# Patient Record
Sex: Male | Born: 1995 | Race: White | Hispanic: No | Marital: Married | Smoking: Current every day smoker
Health system: Southern US, Community
[De-identification: ages and names within clinical notes are randomized; demographics above are authoritative.]

## PROBLEM LIST (undated history)

## (undated) DIAGNOSIS — F419 Anxiety disorder, unspecified: Secondary | ICD-10-CM

## (undated) DIAGNOSIS — F909 Attention-deficit hyperactivity disorder, unspecified type: Secondary | ICD-10-CM

## (undated) DIAGNOSIS — K219 Gastro-esophageal reflux disease without esophagitis: Secondary | ICD-10-CM

## (undated) DIAGNOSIS — J45909 Unspecified asthma, uncomplicated: Secondary | ICD-10-CM

## (undated) DIAGNOSIS — T7840XA Allergy, unspecified, initial encounter: Secondary | ICD-10-CM

## (undated) DIAGNOSIS — R519 Headache, unspecified: Secondary | ICD-10-CM

## (undated) HISTORY — DX: Gastro-esophageal reflux disease without esophagitis: K21.9

## (undated) HISTORY — PX: TONSILLECTOMY: SUR1361

## (undated) HISTORY — DX: Anxiety disorder, unspecified: F41.9

## (undated) HISTORY — DX: Headache, unspecified: R51.9

## (undated) HISTORY — DX: Allergy, unspecified, initial encounter: T78.40XA

---
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
## 2017-01-25 ENCOUNTER — Emergency Department: Payer: BLUE CROSS/BLUE SHIELD

## 2017-01-25 ENCOUNTER — Emergency Department
Admission: EM | Admit: 2017-01-25 | Discharge: 2017-01-25 | Disposition: A | Discharge status: Home / Self Care | Payer: BLUE CROSS/BLUE SHIELD | Attending: Emergency Medicine | Admitting: Emergency Medicine

## 2017-01-25 ENCOUNTER — Other Ambulatory Visit: Payer: Self-pay

## 2017-01-25 DIAGNOSIS — J45909 Unspecified asthma, uncomplicated: Secondary | ICD-10-CM | POA: Diagnosis not present

## 2017-01-25 DIAGNOSIS — R0789 Other chest pain: Secondary | ICD-10-CM

## 2017-01-25 DIAGNOSIS — Z79899 Other long term (current) drug therapy: Secondary | ICD-10-CM | POA: Diagnosis not present

## 2017-01-25 LAB — BASIC METABOLIC PANEL
Anion gap: 6 (ref 5–15)
BUN: 14 mg/dL (ref 6–20)
CALCIUM: 9.6 mg/dL (ref 8.9–10.3)
CO2: 27 mmol/L (ref 22–32)
CREATININE: 1.12 mg/dL (ref 0.61–1.24)
Chloride: 105 mmol/L (ref 101–111)
GFR calc non Af Amer: >60 mL/min (ref >60)
Glucose, Bld: 98 mg/dL (ref 65–99)
Potassium: 4.2 mmol/L (ref 3.5–5.1)
SODIUM: 138 mmol/L (ref 135–145)

## 2017-01-25 LAB — CBC
HCT: 40.5 % (ref 40.0–52.0)
Hemoglobin: 14.1 g/dL (ref 13.0–18.0)
MCH: 28.6 pg (ref 26.0–34.0)
MCHC: 34.7 g/dL (ref 32.0–36.0)
MCV: 82.5 fL (ref 80.0–100.0)
PLATELETS: 207 10*3/uL (ref 150–440)
RBC: 4.91 MIL/uL (ref 4.40–5.90)
RDW: 13.7 % (ref 11.5–14.5)
WBC: 5.9 10*3/uL (ref 3.8–10.6)

## 2017-01-25 LAB — TROPONIN I

## 2017-01-25 MED ORDER — NAPROXEN 500 MG PO TABS: 500.0000 mg | ORAL_TABLET | Freq: Two times a day (BID) | ORAL | 2 refills | Status: DC

## 2017-01-25 NOTE — ED Notes (Signed)
Topaz not working. Pt tried to sign x 2

## 2017-01-25 NOTE — ED Triage Notes (Signed)
Pt c/o chest pain for the last month - feels like "tazers are inside each side of his body and the tazer has a timer on it and goes off every 5 min" - reports at times that he becomes short of breath while talking

## 2017-01-25 NOTE — ED Notes (Signed)
Pt c/o chest pain that radiates down the sides of his chest and "feels like a tazer"

## 2017-01-25 NOTE — ED Provider Notes (Signed)
[facility] Emergency Department Provider Note   ____________________________________________    I have reviewed the triage vital signs and the nursing notes.   HISTORY  Chief Complaint Chest Pain     HPI Joseph Keller is a 21 y.o. male who presents with complaints of bilateral lower flank/chest discomfort.  Patient reports the pain is been occurring over the last 3-4 weeks and is worse with certain movements, he reports it is currently a dull ache.  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  Denies shortness of breath.  No calf pain or swelling.  No history of heart disease.  No nausea or vomiting or abdominal pain.  Has not taken anything for this   Past Medical History:  Diagnosis Date  . ADHD (attention deficit hyperactivity disorder)   . Asthma     Patient Active Problem List   Diagnosis Date Noted  . 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 Other affective psychosis   . MDD (major depressive disorder) 02/27/2015    Past Surgical History:  Procedure Laterality Date  . TONSILLECTOMY      Prior to Admission medications   Medication Sig Start Date End Date Taking? Authorizing Provider  ciprofloxacin (CIPRO) 500 MG tablet Take 1 tablet (500 mg total) by mouth 2 (two) times daily. 07/17/16   Jene EveryKinner, Sarafina Puthoff, MD  divalproex (DEPAKOTE) 250 MG DR tablet Take 250 mg by mouth 2 (two) times daily. 04/27/16   [provider]  escitalopram (LEXAPRO) 10 MG tablet Take 1 tablet (10 mg total) by mouth at bedtime. 03/05/15   Thedora HindersSevilla Saez-Benito, Miriam, MD  ibuprofen (ADVIL,MOTRIN) 600 MG tablet Take 1 tablet by mouth three times daily with meals 05/15/16   Loleta RoseForbach, Cory, MD  lidocaine (LIDODERM) 5 % Place 1 patch onto the skin every 12 (twelve) hours. Remove & Discard patch within 12 hours or as directed by MD.  Wynelle FannyLeave the patch off for 12 hours before applying a new one. 05/15/16 05/15/17  Loleta RoseForbach,  Cory, MD  naproxen (NAPROSYN) 500 MG tablet Take 1 tablet (500 mg total) by mouth 2 (two) times daily with a meal. 01/25/17   Jene EveryKinner, Erbie Arment, MD     Allergies Patient has no known allergies.  No family history on file.  Social History Social History   Tobacco Use  . Smoking status: Never Smoker  . Smokeless tobacco: Never Used  Substance Use Topics  . Alcohol use: No  . Drug use: No    Review of Systems  Constitutional: No fever/chills Eyes: No visual changes.  ENT: No sore throat. Cardiovascular: As above Respiratory: Denies shortness of breath. Gastrointestinal:   No nausea, no vomiting.   Genitourinary: Negative for dysuria. Musculoskeletal: Negative for back pain. Skin: Negative for rash. Neurological: Negative for headaches    ____________________________________________   PHYSICAL EXAM:  VITAL SIGNS: ED Triage Vitals  Enc Vitals Group     BP 01/25/17 1248 132/76     Pulse Rate 01/25/17 1248 85     Resp 01/25/17 1248 16     Temp 01/25/17 1248 98 F (36.7 C)     Temp Source 01/25/17 1248 Oral     SpO2 01/25/17 1248 98 %     Weight 01/25/17 1234 64.4 kg (142 lb)     Height 01/25/17 1234 1.524 m (5')     Head Circumference --      Peak Flow --      Pain Score  01/25/17 1234 9     Pain Loc --      Pain Edu? --      Excl. in GC? --     Constitutional: Alert and oriented. No acute distress. Pleasant and interactive Eyes: Conjunctivae are normal.   Nose: No congestion/rhinnorhea. Mouth/Throat: Mucous membranes are moist.    Cardiovascular: Normal rate, regular rhythm. Grossly normal heart sounds.  Good peripheral circulation. Respiratory: Normal respiratory effort.  No retractions. Lungs CTAB. Gastrointestinal: Soft and nontender. No distention.  No CVA tenderness.  Mild tenderness along bilateral superior oblique muscles  Musculoskeletal:  Warm and well perfused Neurologic:  Normal speech and language. No gross focal neurologic deficits are  appreciated.  Skin:  Skin is warm, dry and intact. No rash noted. Psychiatric: Mood and affect are normal. Speech and behavior are normal.  ____________________________________________   LABS (all labs ordered are listed, but only abnormal results are displayed)  Labs Reviewed  BASIC METABOLIC PANEL  CBC  TROPONIN I   ____________________________________________  EKG  ED ECG REPORT I, Jene EveryKINNER, Solon Alban, the attending physician, personally viewed and interpreted this ECG.  Date: 01/25/2017  Rhythm: normal sinus rhythm QRS Axis: normal Intervals: normal ST/T Wave abnormalities: normal Narrative Interpretation: no evidence of acute ischemia  ____________________________________________  RADIOLOGY  Chest x-ray unremarkable ____________________________________________   PROCEDURES  Procedure(s) performed: No  Procedures   Critical Care performed: No ____________________________________________   INITIAL IMPRESSION / ASSESSMENT AND PLAN / ED COURSE  Pertinent labs & imaging results that were available during my care of the patient were reviewed by me and considered in my medical decision making (see chart for details).  Patient well-appearing in no acute distress.  Several weeks of aching discomfort as documented above.  Differential diagnosis includes musculoskeletal pain, less likely pneumonia/bronchitis.  Very unlikely to be myocarditis or ACS  Lab work is reassuring, chest x-ray is normal.  Given the area of tenderness I suspect muscular skeletal pain will treat with NSAIDs outpatient follow-up as needed.  Return precautions discussed    ____________________________________________   FINAL CLINICAL IMPRESSION(S) / ED DIAGNOSES  Final diagnoses:  Chest wall pain        Note:  This document was prepared using Dragon voice recognition software and may include unintentional dictation errors.    Jene EveryKinner, Paizleigh Wilds, MD 01/25/17 [phone]

## 2017-02-14 ENCOUNTER — Other Ambulatory Visit: Payer: Self-pay

## 2017-02-14 ENCOUNTER — Emergency Department
Admission: EM | Admit: 2017-02-14 | Discharge: 2017-02-14 | Disposition: A | Discharge status: Home / Self Care | Payer: BLUE CROSS/BLUE SHIELD | Attending: Student in an Organized Health Care Education/Training Program | Admitting: Student in an Organized Health Care Education/Training Program

## 2017-02-14 ENCOUNTER — Emergency Department: Payer: BLUE CROSS/BLUE SHIELD

## 2017-02-14 ENCOUNTER — Encounter: Payer: Self-pay | Admitting: *Deleted

## 2017-02-14 DIAGNOSIS — X500XXA Overexertion from strenuous movement or load, initial encounter: Secondary | ICD-10-CM | POA: Insufficient documentation

## 2017-02-14 DIAGNOSIS — R079 Chest pain, unspecified: Secondary | ICD-10-CM | POA: Diagnosis present

## 2017-02-14 DIAGNOSIS — Z79899 Other long term (current) drug therapy: Secondary | ICD-10-CM | POA: Diagnosis not present

## 2017-02-14 DIAGNOSIS — F909 Attention-deficit hyperactivity disorder, unspecified type: Secondary | ICD-10-CM | POA: Insufficient documentation

## 2017-02-14 DIAGNOSIS — R0602 Shortness of breath: Secondary | ICD-10-CM | POA: Insufficient documentation

## 2017-02-14 DIAGNOSIS — J45909 Unspecified asthma, uncomplicated: Secondary | ICD-10-CM | POA: Insufficient documentation

## 2017-02-14 DIAGNOSIS — Z733 Stress, not elsewhere classified: Secondary | ICD-10-CM | POA: Insufficient documentation

## 2017-02-14 LAB — BASIC METABOLIC PANEL
ANION GAP: 8 (ref 5–15)
BUN: 16 mg/dL (ref 6–20)
CHLORIDE: 105 mmol/L (ref 101–111)
CO2: 26 mmol/L (ref 22–32)
Calcium: 9.3 mg/dL (ref 8.9–10.3)
Creatinine, Ser: 1.09 mg/dL (ref 0.61–1.24)
GFR calc non Af Amer: >60 mL/min (ref >60)
Glucose, Bld: 106 mg/dL — ABNORMAL HIGH (ref 65–99)
POTASSIUM: 4.2 mmol/L (ref 3.5–5.1)
SODIUM: 139 mmol/L (ref 135–145)

## 2017-02-14 LAB — CBC
HCT: 37.8 % — ABNORMAL LOW (ref 40.0–52.0)
HEMOGLOBIN: 13.3 g/dL (ref 13.0–18.0)
MCH: 29 pg (ref 26.0–34.0)
MCHC: 35.2 g/dL (ref 32.0–36.0)
MCV: 82.2 fL (ref 80.0–100.0)
Platelets: 221 10*3/uL (ref 150–440)
RBC: 4.6 MIL/uL (ref 4.40–5.90)
RDW: 13.5 % (ref 11.5–14.5)
WBC: 6.6 10*3/uL (ref 3.8–10.6)

## 2017-02-14 LAB — TROPONIN I: Troponin I: <0.03 ng/mL (ref <0.03)

## 2017-02-14 MED ORDER — ALBUTEROL SULFATE HFA 108 (90 BASE) MCG/ACT IN AERS: 2.0000 | INHALATION_SPRAY | Freq: Four times a day (QID) | RESPIRATORY_TRACT | 2 refills | Status: DC | PRN

## 2017-02-14 MED ORDER — PREDNISONE 20 MG PO TABS: 40.0000 mg | ORAL_TABLET | Freq: Every day | ORAL | 0 refills | Status: AC

## 2017-02-14 NOTE — ED Provider Notes (Signed)
[facility] Emergency Department Provider Note    First MD Initiated Contact with Patient 02/14/17 2212     (approximate)  I have reviewed the triage vital signs and the nursing notes.   HISTORY  Chief Complaint Chest Pain    HPI Joseph Keller is a 22 y.o. male presents with several weeks of shortness of breath and anterior chest wall pain.  Patient was seen in the ER for similar symptoms recently.  Was discharged on anti-inflammatories and states that he got better for a short period of time but came back over the past few days.  States he has been having more stress at work and having to lift heavy objects as he works at [employer].  States he was lifting something out of the door today and felt something pop in his chest so he came to the ER.  Does have a history of asthma.  Has had a nonproductive cough.  No fevers.  No recent antibiotics.  Past Medical History:  Diagnosis Date  . ADHD (attention deficit hyperactivity disorder)   . Asthma    History reviewed. No pertinent family history. Past Surgical History:  Procedure Laterality Date  . TONSILLECTOMY     Patient Active Problem List   Diagnosis Date Noted  . 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 Other affective psychosis   . MDD (major depressive disorder) 02/27/2015      Prior to Admission medications   Medication Sig Start Date End Date Taking? Authorizing Provider  albuterol (PROVENTIL HFA;VENTOLIN HFA) 108 (90 Base) MCG/ACT inhaler Inhale 2 puffs into the lungs every 6 (six) hours as needed for wheezing or shortness of breath. 02/14/17   Willy Eddyobinson, Jamila Slatten, MD  ciprofloxacin (CIPRO) 500 MG tablet Take 1 tablet (500 mg total) by mouth 2 (two) times daily. 07/17/16   Jene EveryKinner, Robert, MD  divalproex (DEPAKOTE) 250 MG DR tablet Take 250 mg by mouth 2 (two) times daily. 04/27/16   [provider]  escitalopram (LEXAPRO) 10 MG tablet Take 1  tablet (10 mg total) by mouth at bedtime. 03/05/15   Thedora HindersSevilla Saez-Benito, Miriam, MD  ibuprofen (ADVIL,MOTRIN) 600 MG tablet Take 1 tablet by mouth three times daily with meals 05/15/16   Loleta RoseForbach, Cory, MD  lidocaine (LIDODERM) 5 % Place 1 patch onto the skin every 12 (twelve) hours. Remove & Discard patch within 12 hours or as directed by MD.  Wynelle FannyLeave the patch off for 12 hours before applying a new one. 05/15/16 05/15/17  Loleta RoseForbach, Cory, MD  naproxen (NAPROSYN) 500 MG tablet Take 1 tablet (500 mg total) by mouth 2 (two) times daily with a meal. 01/25/17   Jene EveryKinner, Robert, MD  predniSONE (DELTASONE) 20 MG tablet Take 2 tablets (40 mg total) by mouth daily for 5 days. 02/14/17 02/19/17  Willy Eddyobinson, Tarrie Mcmichen, MD    Allergies Patient has no known allergies.    Social History Social History   Tobacco Use  . Smoking status: Never Smoker  . Smokeless tobacco: Never Used  Substance Use Topics  . Alcohol use: No  . Drug use: No    Review of Systems Patient denies headaches, rhinorrhea, blurry vision, numbness, shortness of breath, chest pain, edema, cough, abdominal pain, nausea, vomiting, diarrhea, dysuria, fevers, rashes or hallucinations unless otherwise stated above in HPI. ____________________________________________   PHYSICAL EXAM:  VITAL SIGNS: Vitals:   02/14/17 2059  BP: 119/71  Pulse: 78  Resp: 16  Temp: 97.6 F (36.4 C)  SpO2:  97%    Constitutional: Alert and oriented. Well appearing and in no acute distress. Eyes: Conjunctivae are normal.  Head: Atraumatic. Nose: No congestion/rhinnorhea. Mouth/Throat: Mucous membranes are moist.   Neck: No stridor. Painless ROM.  Cardiovascular: Normal rate, regular rhythm. Grossly normal heart sounds.  Good peripheral circulation. Respiratory: Normal respiratory effort.  No retractions. Lungs with coarse breath sounds bilaterally Gastrointestinal: Soft and nontender. No distention. No abdominal bruits. No CVA tenderness. Genitourinary:    Musculoskeletal: + ttp of anterior chest wall. No lower extremity tenderness nor edema.  No joint effusions. Neurologic:  Normal speech and language. No gross focal neurologic deficits are appreciated. No facial droop Skin:  Skin is warm, dry and intact. No rash noted. Psychiatric: Mood and affect are normal. Speech and behavior are normal.  ____________________________________________   LABS (all labs ordered are listed, but only abnormal results are displayed)  Results for orders placed or performed during the hospital encounter of 02/14/17 (from the past 24 hour(s))  Basic metabolic panel     Status: Abnormal   Collection Time: 02/14/17  8:56 PM  Result Value Ref Range   Sodium 139 135 - 145 mmol/L   Potassium 4.2 3.5 - 5.1 mmol/L   Chloride 105 101 - 111 mmol/L   CO2 26 22 - 32 mmol/L   Glucose, Bld 106 (H) 65 - 99 mg/dL   BUN 16 6 - 20 mg/dL   Creatinine, Ser 1.61 0.61 - 1.24 mg/dL   Calcium 9.3 8.9 - 09.6 mg/dL   GFR calc non Af Amer >60 >60 mL/min   GFR calc Af Amer >60 >60 mL/min   Anion gap 8 5 - 15  CBC     Status: Abnormal   Collection Time: 02/14/17  8:56 PM  Result Value Ref Range   WBC 6.6 3.8 - 10.6 K/uL   RBC 4.60 4.40 - 5.90 MIL/uL   Hemoglobin 13.3 13.0 - 18.0 g/dL   HCT 04.5 (L) 40.9 - 81.1 %   MCV 82.2 80.0 - 100.0 fL   MCH 29.0 26.0 - 34.0 pg   MCHC 35.2 32.0 - 36.0 g/dL   RDW 91.4 78.2 - 95.6 %   Platelets 221 150 - 440 K/uL  Troponin I     Status: None   Collection Time: 02/14/17  8:56 PM  Result Value Ref Range   Troponin I <0.03 <0.03 ng/mL   ____________________________________________  EKG My review and personal interpretation at Time: 21:00   Indication: chest pain  Rate: 75  Rhythm: sinus Axis: normal Other: normal intervals, no wpw, brugada ____________________________________________  RADIOLOGY  I personally reviewed all radiographic images ordered to evaluate for the above acute complaints and reviewed radiology reports and  findings.  These findings were personally discussed with the patient.  Please see medical record for radiology report.  ____________________________________________   PROCEDURES  Procedure(s) performed:  Procedures    Critical Care performed: no ____________________________________________   INITIAL IMPRESSION / ASSESSMENT AND PLAN / ED COURSE  Pertinent labs & imaging results that were available during my care of the patient were reviewed by me and considered in my medical decision making (see chart for details).  DDX: ACS, pericarditis, esophagitis, boerhaaves, pe, dissection, pna, bronchitis, costochondritis   Joseph Solum is a 22 y.o. who presents to the ED with chest pain as described above.  Seems primarily muscular skeletal in nature given his report of worsening with movement and heavy lifting.  EKG shows no ischemia or dysrhythmia.  Troponin is negative.  Patient is low risk by Wells and is PERC negative.  Chest x-ray shows no pneumothorax or pneumonia.  Does have some coarse breath sounds with his history of asthma we will treat for bronchitis.  May also have component of pleurisy.  Will have patient continue with anti-inflammatories and follow-up with PCP.  Have discussed with the patient and available family all diagnostics and treatments performed thus far and all questions were answered to the best of my ability. The patient demonstrates understanding and agreement with plan.       ____________________________________________   FINAL CLINICAL IMPRESSION(S) / ED DIAGNOSES  Final diagnoses:  Chest pain, unspecified type      NEW MEDICATIONS STARTED DURING THIS VISIT:  New Prescriptions   ALBUTEROL (PROVENTIL HFA;VENTOLIN HFA) 108 (90 BASE) MCG/ACT INHALER    Inhale 2 puffs into the lungs every 6 (six) hours as needed for wheezing or shortness of breath.   PREDNISONE (DELTASONE) 20 MG TABLET    Take 2 tablets (40 mg total) by mouth daily for 5 days.      Note:  This document was prepared using Dragon voice recognition software and may include unintentional dictation errors.    Willy Eddy, MD 02/14/17 [phone]

## 2017-02-14 NOTE — ED Notes (Signed)
Pt signed esignature.  D/c isnt to pt

## 2017-02-14 NOTE — ED Notes (Signed)
Pt reports intermittent chest pain.  Pt states coughing up clear mucous. No fever.  No sob. Nonsmoker. Pt alert.  family with pt

## 2017-02-14 NOTE — ED Triage Notes (Signed)
Pt to ED reporting return of burning chest pain over the past 2 days. Pt reports he was seen two weeks ago and given medication. Pt reports pain diminished but has since returned. Intermittent SOB and productive cough reported. No fevers. Pain radiated to both sides of ribs.

## 2017-05-20 ENCOUNTER — Other Ambulatory Visit: Payer: Self-pay

## 2017-05-20 DIAGNOSIS — R51 Headache: Secondary | ICD-10-CM | POA: Insufficient documentation

## 2017-05-20 DIAGNOSIS — J45909 Unspecified asthma, uncomplicated: Secondary | ICD-10-CM | POA: Insufficient documentation

## 2017-05-20 DIAGNOSIS — Z79899 Other long term (current) drug therapy: Secondary | ICD-10-CM | POA: Insufficient documentation

## 2017-05-20 NOTE — ED Triage Notes (Signed)
Pt reports that he has had a headache for the past 4 days that has progressively gotten worse - reports photophobia

## 2017-05-20 NOTE — ED Notes (Signed)
Significant other to stat desk asking about wait time. Given update on wait time. Verbalizes understanding.

## 2017-05-21 ENCOUNTER — Emergency Department: Payer: BLUE CROSS/BLUE SHIELD

## 2017-05-21 ENCOUNTER — Emergency Department
Admission: EM | Admit: 2017-05-21 | Discharge: 2017-05-21 | Disposition: A | Discharge status: Home / Self Care | Payer: BLUE CROSS/BLUE SHIELD | Attending: Emergency Medicine | Admitting: Emergency Medicine

## 2017-05-21 DIAGNOSIS — R519 Headache, unspecified: Secondary | ICD-10-CM

## 2017-05-21 DIAGNOSIS — R51 Headache: Secondary | ICD-10-CM

## 2017-05-21 MED ORDER — KETOROLAC TROMETHAMINE 30 MG/ML IJ SOLN
10.0000 mg | Freq: Once | INTRAMUSCULAR | Status: AC
  Administered 2017-05-21: 9.9 mg via INTRAVENOUS

## 2017-05-21 MED ORDER — PROCHLORPERAZINE EDISYLATE 10 MG/2ML IJ SOLN
5.0000 mg | Freq: Once | INTRAMUSCULAR | Status: DC
  Filled 2017-05-21: qty 2

## 2017-05-21 MED ORDER — PROCHLORPERAZINE EDISYLATE 10 MG/2ML IJ SOLN
5.0000 mg | INTRAMUSCULAR | Status: AC
  Administered 2017-05-21: 5 mg via INTRAVENOUS

## 2017-05-21 MED ORDER — PROCHLORPERAZINE MALEATE 10 MG PO TABS: 10.0000 mg | ORAL_TABLET | Freq: Four times a day (QID) | ORAL | 0 refills | Status: DC | PRN

## 2017-05-21 MED ORDER — DIPHENHYDRAMINE HCL 50 MG/ML IJ SOLN
25.0000 mg | Freq: Once | INTRAMUSCULAR | Status: DC
  Filled 2017-05-21: qty 1

## 2017-05-21 MED ORDER — DIPHENHYDRAMINE HCL 50 MG/ML IJ SOLN
25.0000 mg | Freq: Once | INTRAMUSCULAR | Status: AC
  Administered 2017-05-21: 25 mg via INTRAVENOUS

## 2017-05-21 MED ORDER — KETOROLAC TROMETHAMINE 30 MG/ML IJ SOLN
15.0000 mg | Freq: Once | INTRAMUSCULAR | Status: DC
  Filled 2017-05-21: qty 1

## 2017-05-21 NOTE — ED Notes (Signed)
Reviewed discharge instructions, follow-up care, and prescriptions with patient and family. Patient  And familyverbalized understanding of all information reviewed. Patient stable, with no distress noted at this time.

## 2017-05-21 NOTE — ED Provider Notes (Signed)
[facility] Emergency Department Provider Note   ____________________________________________   First MD Initiated Contact with Patient 05/21/17 [phone]     (approximate)  I have reviewed the triage vital signs and the nursing notes.   HISTORY  Chief Complaint Headache    HPI Joseph Keller is a 22 y.o. male who presents to the ED from home with a chief complaint of headache.  Patient has a history of depression, no history of migraine headaches who reports a headache at his temples for the past 4 days.  Describes the pain as a "warm stabbing", gradual onset, nonradiating pain associated with photophobia only in sunlight, not with any other light.  Vomited twice yesterday but none today.  Has tried over-the-counter ibuprofen with partial relief of symptoms.  Denies recent fever, chills, vision changes, neck pain, sinus congestion/pressure chest pain, shortness of breath, abdominal pain, diarrhea.  Denies recent travel or trauma.   Past Medical History:  Diagnosis Date  . ADHD (attention deficit hyperactivity disorder)   . Asthma     Patient Active Problem List   Diagnosis Date Noted  . 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 Other affective psychosis   . MDD (major depressive disorder) 02/27/2015    Past Surgical History:  Procedure Laterality Date  . TONSILLECTOMY      Prior to Admission medications   Medication Sig Start Date End Date Taking? Authorizing Provider  albuterol (PROVENTIL HFA;VENTOLIN HFA) 108 (90 Base) MCG/ACT inhaler Inhale 2 puffs into the lungs every 6 (six) hours as needed for wheezing or shortness of breath. 02/14/17   Willy Eddy, MD  ciprofloxacin (CIPRO) 500 MG tablet Take 1 tablet (500 mg total) by mouth 2 (two) times daily. 07/17/16   Jene Every, MD  divalproex (DEPAKOTE) 250 MG DR tablet Take 250 mg by mouth 2 (two) times daily. 04/27/16   [provider]    escitalopram (LEXAPRO) 10 MG tablet Take 1 tablet (10 mg total) by mouth at bedtime. 03/05/15   Thedora Hinders, MD  ibuprofen (ADVIL,MOTRIN) 600 MG tablet Take 1 tablet by mouth three times daily with meals 05/15/16   Loleta Rose, MD  naproxen (NAPROSYN) 500 MG tablet Take 1 tablet (500 mg total) by mouth 2 (two) times daily with a meal. 01/25/17   Jene Every, MD    Allergies Patient has no known allergies.  Family history None for migraine headaches None for cerebral aneurysm  Social History Social History   Tobacco Use  . Smoking status: Never Smoker  . Smokeless tobacco: Never Used  Substance Use Topics  . Alcohol use: No  . Drug use: No    Review of Systems  Constitutional: No fever/chills. Eyes: No visual changes. ENT: No sore throat. Cardiovascular: Denies chest pain. Respiratory: Denies shortness of breath. Gastrointestinal: No abdominal pain.  No nausea, no vomiting.  No diarrhea.  No constipation. Genitourinary: Negative for dysuria. Musculoskeletal: Negative for back pain. Skin: Negative for rash. Neurological: Positive for headache.  Negative for focal weakness or numbness.   ____________________________________________   PHYSICAL EXAM:  VITAL SIGNS: ED Triage Vitals  Enc Vitals Group     BP 05/20/17 1824 121/79     Pulse Rate 05/20/17 1824 70     Resp 05/20/17 1824 16     Temp 05/20/17 1824 98 F (36.7 C)     Temp Source 05/20/17 1824 Oral     SpO2 05/20/17 1824 99 %     Weight  05/20/17 1823 144 lb (65.3 kg)     Height 05/20/17 1823 5' (1.524 m)     Head Circumference --      Peak Flow --      Pain Score 05/20/17 1822 8     Pain Loc --      Pain Edu? --      Excl. in GC? --     Constitutional: Alert and oriented. Well appearing and in no acute distress. Eyes: Conjunctivae are normal. PERRL. EOMI. Head: Atraumatic.  Sinuses nontender to palpation.  Mild pain to bilateral temples on palpation. Nose: No  congestion/rhinnorhea. Mouth/Throat: Mucous membranes are moist.  Oropharynx non-erythematous. Neck: No stridor.  No carotid bruits.  Supple neck without meningismus. Cardiovascular: Normal rate, regular rhythm. Grossly normal heart sounds.  Good peripheral circulation. Respiratory: Normal respiratory effort.  No retractions. Lungs CTAB. Gastrointestinal: Soft and nontender. No distention. No abdominal bruits. No CVA tenderness. Musculoskeletal: No lower extremity tenderness nor edema.  No joint effusions. Neurologic: Alert and oriented x3.  CN II-XII grossly intact.  MAEx4.  Normal speech and language. No gross focal neurologic deficits are appreciated. No gait instability. Skin:  Skin is warm, dry and intact. No rash noted.  No petechiae. Psychiatric: Mood and affect are normal. Speech and behavior are normal.  ____________________________________________   LABS (all labs ordered are listed, but only abnormal results are displayed)  Labs Reviewed - No data to display ____________________________________________  EKG  None ____________________________________________  RADIOLOGY  ED MD interpretation: No acute intracranial abnormalities  Official radiology report(s): Ct Head Wo Contrast  Result Date: 05/21/2017 CLINICAL DATA:  Acute onset of worsening headache.  Photophobia. EXAM: CT HEAD WITHOUT CONTRAST TECHNIQUE: Contiguous axial images were obtained from the base of the skull through the vertex without intravenous contrast. COMPARISON:  CT of the head performed 12/12/2013 FINDINGS: Brain: No evidence of acute infarction, hemorrhage, hydrocephalus, extra-axial collection or mass lesion/mass effect. The posterior fossa, including the cerebellum, brainstem and fourth ventricle, is within normal limits. The third and lateral ventricles, and basal ganglia are unremarkable in appearance. The cerebral hemispheres are symmetric in appearance, with normal gray-white differentiation. No mass  effect or midline shift is seen. Vascular: No hyperdense vessel or unexpected calcification. Skull: There is no evidence of fracture; visualized osseous structures are unremarkable in appearance. Sinuses/Orbits: The visualized portions of the orbits are within normal limits. The paranasal sinuses and mastoid air cells are well-aerated. Other: No significant soft tissue abnormalities are seen. IMPRESSION: Unremarkable noncontrast CT of the head. Electronically Signed   By: Roanna RaiderJeffery  Chang M.D.   On: 05/21/2017 02:24    ____________________________________________   PROCEDURES  Procedure(s) performed: None  Procedures  Critical Care performed: No  ____________________________________________   INITIAL IMPRESSION / ASSESSMENT AND PLAN / ED COURSE  As part of my medical decision making, I reviewed the following data within the electronic MEDICAL RECORD NUMBER History obtained from family, Nursing notes reviewed and incorporated, Old chart reviewed, Radiograph reviewed and Notes from prior ED visits   22 year old male who presents with a 4-day history of gradual onset headache. Differential diagnosis includes, but is not limited to, intracranial hemorrhage, meningitis/encephalitis, previous head trauma, cavernous venous thrombosis, tension headache, temporal arteritis, migraine or migraine equivalent, idiopathic intracranial hypertension, and non-specific headache.  Patient is well-appearing, supple neck on exam, no focal neurological deficits.  Discussed risk/benefits of obtaining CT scan of the head; patient requests CT scan.  Will administer IM Compazine, Benadryl and Toradol; will reassess.  Clinical Course  as of May 21 253  Sun May 21, 2017  0253 Updated patient and family member negative CT results.  He is resting comfortably without headache.  Remains neurologically intact without focal deficits; neck remains supple without meningismus.  Strict return precautions given.  Patient and family  member verbalize understanding and agree with plan of care.   [JS]    Clinical Course User Index [JS] Irean Hong, MD     ____________________________________________   FINAL CLINICAL IMPRESSION(S) / ED DIAGNOSES  Final diagnoses:  Acute nonintractable headache, unspecified headache type     ED Discharge Orders    None       Note:  This document was prepared using Dragon voice recognition software and may include unintentional dictation errors.    Irean Hong, MD 05/21/17 [phone]

## 2017-05-21 NOTE — Discharge Instructions (Addendum)
1.  You may take Compazine (#20) in addition to Tylenol/Ibuprofen as needed for headache/vomiting. 2.  Return to the ER for worsening symptoms, persistent vomiting, lethargy or other concerns.

## 2018-04-25 ENCOUNTER — Emergency Department: Payer: PRIVATE HEALTH INSURANCE

## 2018-04-25 ENCOUNTER — Emergency Department
Admission: EM | Admit: 2018-04-25 | Discharge: 2018-04-25 | Disposition: A | Discharge status: Home / Self Care | Payer: PRIVATE HEALTH INSURANCE | Attending: Emergency Medicine | Admitting: Emergency Medicine

## 2018-04-25 ENCOUNTER — Encounter: Payer: Self-pay | Admitting: Emergency Medicine

## 2018-04-25 ENCOUNTER — Other Ambulatory Visit: Payer: Self-pay

## 2018-04-25 DIAGNOSIS — J45909 Unspecified asthma, uncomplicated: Secondary | ICD-10-CM | POA: Diagnosis not present

## 2018-04-25 DIAGNOSIS — R197 Diarrhea, unspecified: Secondary | ICD-10-CM | POA: Diagnosis present

## 2018-04-25 DIAGNOSIS — K529 Noninfective gastroenteritis and colitis, unspecified: Secondary | ICD-10-CM | POA: Diagnosis not present

## 2018-04-25 LAB — COMPREHENSIVE METABOLIC PANEL
ALK PHOS: 67 U/L (ref 38–126)
ALT: 23 U/L (ref 0–44)
ANION GAP: 7 (ref 5–15)
AST: 24 U/L (ref 15–41)
Albumin: 4.5 g/dL (ref 3.5–5.0)
BILIRUBIN TOTAL: 0.9 mg/dL (ref 0.3–1.2)
BUN: 14 mg/dL (ref 6–20)
CALCIUM: 9.5 mg/dL (ref 8.9–10.3)
CO2: 26 mmol/L (ref 22–32)
Chloride: 105 mmol/L (ref 98–111)
Creatinine, Ser: 0.93 mg/dL (ref 0.61–1.24)
GFR calc Af Amer: >60 mL/min (ref >60)
GFR calc non Af Amer: >60 mL/min (ref >60)
Glucose, Bld: 109 mg/dL — ABNORMAL HIGH (ref 70–99)
Potassium: 3.9 mmol/L (ref 3.5–5.1)
Sodium: 138 mmol/L (ref 135–145)
Total Protein: 7.6 g/dL (ref 6.5–8.1)

## 2018-04-25 LAB — CBC WITH DIFFERENTIAL/PLATELET
Abs Immature Granulocytes: 0.01 10*3/uL (ref 0.00–0.07)
BASOS ABS: 0.1 10*3/uL (ref 0.0–0.1)
BASOS PCT: 1 %
Eosinophils Absolute: 0.2 10*3/uL (ref 0.0–0.5)
Eosinophils Relative: 4 %
HEMATOCRIT: 38.7 % — AB (ref 39.0–52.0)
HEMOGLOBIN: 13.7 g/dL (ref 13.0–17.0)
IMMATURE GRANULOCYTES: 0 %
LYMPHS PCT: 33 %
Lymphs Abs: 1.3 10*3/uL (ref 0.7–4.0)
MCH: 28.9 pg (ref 26.0–34.0)
MCHC: 35.4 g/dL (ref 30.0–36.0)
MCV: 81.6 fL (ref 80.0–100.0)
Monocytes Absolute: 0.3 10*3/uL (ref 0.1–1.0)
Monocytes Relative: 8 %
NEUTROS ABS: 2.1 10*3/uL (ref 1.7–7.7)
NEUTROS PCT: 54 %
Platelets: 201 10*3/uL (ref 150–400)
RBC: 4.74 MIL/uL (ref 4.22–5.81)
RDW: 12.8 % (ref 11.5–15.5)
WBC: 4 10*3/uL (ref 4.0–10.5)
nRBC: 0 % (ref 0.0–0.2)

## 2018-04-25 LAB — LIPASE, BLOOD: Lipase: 24 U/L (ref 11–51)

## 2018-04-25 MED ORDER — ONDANSETRON 4 MG PO TBDP: 4.0000 mg | ORAL_TABLET | Freq: Three times a day (TID) | ORAL | 0 refills | Status: DC | PRN

## 2018-04-25 MED ORDER — POLYETHYLENE GLYCOL 3350 17 G PO PACK: 17.0000 g | PACK | Freq: Every day | ORAL | 0 refills | Status: DC | PRN

## 2018-04-25 MED ORDER — SODIUM CHLORIDE 0.9 % IV SOLN
Freq: Once | INTRAVENOUS | Status: AC
  Administered 2018-04-25: 13:00:00 via INTRAVENOUS

## 2018-04-25 MED ORDER — MORPHINE SULFATE (PF) 4 MG/ML IV SOLN: 4.0000 mg | Freq: Once | INTRAVENOUS | Status: DC

## 2018-04-25 MED ORDER — ONDANSETRON HCL 4 MG/2ML IJ SOLN
4.0000 mg | Freq: Once | INTRAMUSCULAR | Status: AC
  Administered 2018-04-25: 4 mg via INTRAVENOUS
  Filled 2018-04-25: qty 2

## 2018-04-25 NOTE — ED Triage Notes (Signed)
Patient reports abdominal cramping, nausea and diarrhea that started last night. Patient denies being around anyone sick. Denies fever.

## 2018-04-25 NOTE — ED Notes (Signed)
Patient reports he was able to drink Pepsi and keep it down but then would have diarrhea

## 2018-04-25 NOTE — ED Notes (Signed)
Patient reports he does not need morphine at this time and will let this RN know if he needs it

## 2018-04-25 NOTE — ED Provider Notes (Signed)
[facility] Emergency Department Provider Note       Time seen: ----------------------------------------- 12:41 PM on 04/25/2018 -----------------------------------------   I have reviewed the triage vital signs and the nursing notes.  HISTORY   Chief Complaint Abdominal Pain; Emesis; and Diarrhea    HPI Joseph Keller is a 23 y.o. male with a history of asthma, depression who presents to the ED for abdominal cramping with nausea and diarrhea that started last night.  Patient denies being around anyone sick recently, denies a fever.  Past Medical History:  Diagnosis Date  . ADHD (attention deficit hyperactivity disorder)   . Asthma     Patient Active Problem List   Diagnosis Date Noted  . 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 Other affective psychosis   . MDD (major depressive disorder) 02/27/2015    Past Surgical History:  Procedure Laterality Date  . TONSILLECTOMY      Allergies Patient has no known allergies.  Social History Social History   Tobacco Use  . Smoking status: Never Smoker  . Smokeless tobacco: Never Used  Substance Use Topics  . Alcohol use: No  . Drug use: No   Review of Systems Constitutional: Negative for fever. Cardiovascular: Negative for chest pain. Respiratory: Negative for shortness of breath. Gastrointestinal: Positive for abdominal pain, nausea and diarrhea Musculoskeletal: Negative for back pain. Skin: Negative for rash. Neurological: Negative for headaches, focal weakness or numbness.  All systems negative/normal/unremarkable except as stated in the HPI  ____________________________________________   PHYSICAL EXAM:  VITAL SIGNS: ED Triage Vitals  Enc Vitals Group     BP 04/25/18 1235 (!) 141/85     Pulse Rate 04/25/18 1235 81     Resp 04/25/18 1235 16     Temp 04/25/18 1235 98.1 F (36.7 C)     Temp Source 04/25/18 1235 Oral     SpO2 04/25/18  1235 99 %     Weight 04/25/18 1236 135 lb (61.2 kg)     Height 04/25/18 1236 5\' 1"  (1.549 m)     Head Circumference --      Peak Flow --      Pain Score 04/25/18 1236 8     Pain Loc --      Pain Edu? --      Excl. in GC? --    Constitutional: Alert and oriented. Well appearing and in no distress. Eyes: Conjunctivae are normal. Normal extraocular movements. ENT      Head: Normocephalic and atraumatic.      Nose: No congestion/rhinnorhea.      Mouth/Throat: Mucous membranes are moist.      Neck: No stridor. Cardiovascular: Normal rate, regular rhythm. No murmurs, rubs, or gallops. Respiratory: Normal respiratory effort without tachypnea nor retractions. Breath sounds are clear and equal bilaterally. No wheezes/rales/rhonchi. Gastrointestinal: Soft and nontender. Normal bowel sounds Musculoskeletal: Nontender with normal range of motion in extremities. No lower extremity tenderness nor edema. Neurologic:  Normal speech and language. No gross focal neurologic deficits are appreciated.  Skin:  Skin is warm, dry and intact. No rash noted. Psychiatric: Mood and affect are normal. Speech and behavior are normal.  ____________________________________________  ED COURSE:  As part of my medical decision making, I reviewed the following data within the electronic MEDICAL RECORD NUMBER History obtained from family if available, nursing notes, old chart and ekg, as well as notes from prior ED visits. Patient presented for abdominal cramping with nausea and diarrhea, we will assess  with labs and imaging as indicated at this time.   Procedures ____________________________________________   LABS (pertinent positives/negatives)  Labs Reviewed  CBC WITH DIFFERENTIAL/PLATELET - Abnormal; Notable for the following components:      Result Value   HCT 38.7 (*)    All other components within normal limits  COMPREHENSIVE METABOLIC PANEL - Abnormal; Notable for the following components:   Glucose, Bld 109  (*)    All other components within normal limits  LIPASE, BLOOD   Abdomen 2 view IMPRESSION: Moderate stool in colon. No bowel obstruction or free air. Lung bases clear.  ____________________________________________   DIFFERENTIAL DIAGNOSIS   Gastroenteritis, dehydration, electrolyte abnormality  FINAL ASSESSMENT AND PLAN  Gastroenteritis   Plan: The patient had presented for abdominal cramping with nausea and diarrhea. Patient's labs did not reveal any acute process, imaging revealed some constipation.  He will be discharged with antiemetics and is cleared for outpatient follow-up as needed.   Ulice Dash, MD    Note: This note was generated in part or whole with voice recognition software. Voice recognition is usually quite accurate but there are transcription errors that can and very often do occur. I apologize for any typographical errors that were not detected and corrected.     Emily Filbert, MD 04/25/18 [phone]

## 2018-04-25 NOTE — ED Notes (Signed)
Patient transported to X-ray 

## 2018-05-31 ENCOUNTER — Encounter: Payer: Self-pay | Admitting: Emergency Medicine

## 2018-05-31 ENCOUNTER — Emergency Department
Admission: EM | Admit: 2018-05-31 | Discharge: 2018-05-31 | Disposition: A | Discharge status: Home / Self Care | Payer: PRIVATE HEALTH INSURANCE | Attending: Emergency Medicine | Admitting: Emergency Medicine

## 2018-05-31 ENCOUNTER — Other Ambulatory Visit: Payer: Self-pay

## 2018-05-31 DIAGNOSIS — F17210 Nicotine dependence, cigarettes, uncomplicated: Secondary | ICD-10-CM | POA: Diagnosis not present

## 2018-05-31 DIAGNOSIS — J029 Acute pharyngitis, unspecified: Secondary | ICD-10-CM | POA: Diagnosis present

## 2018-05-31 DIAGNOSIS — Z20828 Contact with and (suspected) exposure to other viral communicable diseases: Secondary | ICD-10-CM | POA: Insufficient documentation

## 2018-05-31 LAB — GROUP A STREP BY PCR: Group A Strep by PCR: NOT DETECTED

## 2018-05-31 NOTE — ED Notes (Signed)
Pt reports e-cigarettes (Vape pen) use averaging about 5 times every day.

## 2018-05-31 NOTE — ED Notes (Signed)
CHL not loading on the computer in pt's room at this time and unable to print yellow label for Group A Strep lab test. Sample sent with white chart label after verification of correct pt name and DOB.

## 2018-05-31 NOTE — Discharge Instructions (Signed)
As we discussed, we believe your symptoms are caused by a virus (a viral pharyngitis).  However, because we cannot rule out the possibility of COVID-19 at this time, we recommend that you self-quarantine at home for 14 days, or until 3 consecutive days without fever (without taking medication to make your temperature come down, such as Tylenol (acetaminophen), after your respiratory symptoms have improved, and after at least 7 days have passed since your symptoms first appeared.  You should have as minimal contact as possible with anyone else including close family as per the [facility] paperwork guidelines listed below. Follow-up with your doctor by phone or online as needed and return immediately to the emergency department or call 911 only if you develop new or worsening symptoms that concern you.  Please remember that we DID send a swab to the lab, but the results will not be available for a few days.  You will be contacted if the test is positive, indicating that you do have the condition, but you may not be contacted if the test is negative (think "no news is good news").  You can find up-to-date information about COVID-19 in [place] by calling the [employer] Helpline: [phone]. You may also call 2-1-1, or [phone], or additional resources.  You can also find information online at PureLoser.gl, or on the Center for Disease Control (CDC) website at http://bradshaw.com/.     Person Under Monitoring Name: Joseph Keller  Location: [address] [place] [address]   Infection Prevention Recommendations for Individuals Confirmed to have, or Being Evaluated for, 2019 Novel Coronavirus (COVID-19) Infection Who Receive Care at Home  Individuals who are confirmed to have, or are being evaluated for, COVID-19 should follow the prevention  steps below until a healthcare provider or local or state health department says they can return to normal activities.  Stay home except to get medical care You should restrict activities outside your home, except for getting medical care. Do not go to work, school, or public areas, and do not use public transportation or taxis.  Call ahead before visiting your doctor Before your medical appointment, call the healthcare provider and tell them that you have, or are being evaluated for, COVID-19 infection. This will help the healthcare providers office take steps to keep other people from getting infected. Ask your healthcare provider to call the local or state health department.  Monitor your symptoms Seek prompt medical attention if your illness is worsening (e.g., difficulty breathing). Before going to your medical appointment, call the healthcare provider and tell them that you have, or are being evaluated for, COVID-19 infection. Ask your healthcare provider to call the local or state health department.  Wear a facemask You should wear a facemask that covers your nose and mouth when you are in the same room with other people and when you visit a healthcare provider. People who live with or visit you should also wear a facemask while they are in the same room with you.  Separate yourself from other people in your home As much as possible, you should stay in a different room from other people in your home. Also, you should use a separate bathroom, if available.  Avoid sharing household items You should not share dishes, drinking glasses, cups, eating utensils, towels, bedding, or other items with other people in your home. After using these items, you should wash them thoroughly with soap and water.  Cover your coughs and sneezes Cover your mouth  and nose with a tissue when you cough or sneeze, or you can cough or sneeze into your sleeve. Throw used tissues in a lined trash can, and  immediately wash your hands with soap and water for at least 20 seconds or use an alcohol-based hand rub.  Wash your [employer] your hands often and thoroughly with soap and water for at least 20 seconds. You can use an alcohol-based hand sanitizer if soap and water are not available and if your hands are not visibly dirty. Avoid touching your eyes, nose, and mouth with unwashed hands.   Prevention Steps for Caregivers and Household Members of Individuals Confirmed to have, or Being Evaluated for, COVID-19 Infection Being Cared for in the Home  If you live with, or provide care at home for, a person confirmed to have, or being evaluated for, COVID-19 infection please follow these guidelines to prevent infection:  Follow healthcare providers instructions Make sure that you understand and can help the patient follow any healthcare provider instructions for all care.  Provide for the patients basic needs You should help the patient with basic needs in the home and provide support for getting groceries, prescriptions, and other personal needs.  Monitor the patients symptoms If they are getting sicker, call his or her medical provider and tell them that the patient has, or is being evaluated for, COVID-19 infection. This will help the healthcare providers office take steps to keep other people from getting infected. Ask the healthcare provider to call the local or state health department.  Limit the number of people who have contact with the patient If possible, have only one caregiver for the patient. Other household members should stay in another home or place of residence. If this is not possible, they should stay in another room, or be separated from the patient as much as possible. Use a separate bathroom, if available. Restrict visitors who do not have an essential need to be in the home.  Keep older adults, very young children, and other sick people away from the patient Keep  older adults, very young children, and those who have compromised immune systems or chronic health conditions away from the patient. This includes people with chronic heart, lung, or kidney conditions, diabetes, and cancer.  Ensure good ventilation Make sure that shared spaces in the home have good air flow, such as from an air conditioner or an opened window, weather permitting.  Wash your hands often Wash your hands often and thoroughly with soap and water for at least 20 seconds. You can use an alcohol based hand sanitizer if soap and water are not available and if your hands are not visibly dirty. Avoid touching your eyes, nose, and mouth with unwashed hands. Use disposable paper towels to dry your hands. If not available, use dedicated cloth towels and replace them when they become wet.  Wear a facemask and gloves Wear a disposable facemask at all times in the room and gloves when you touch or have contact with the patients blood, body fluids, and/or secretions or excretions, such as sweat, saliva, sputum, nasal mucus, vomit, urine, or feces.  Ensure the mask fits over your nose and mouth tightly, and do not touch it during use. Throw out disposable facemasks and gloves after using them. Do not reuse. Wash your hands immediately after removing your facemask and gloves. If your personal clothing becomes contaminated, carefully remove clothing and launder. Wash your hands after handling contaminated clothing. Place all used disposable facemasks, gloves,  and other waste in a lined container before disposing them with other household waste. Remove gloves and wash your hands immediately after handling these items.  Do not share dishes, glasses, or other household items with the patient Avoid sharing household items. You should not share dishes, drinking glasses, cups, eating utensils, towels, bedding, or other items with a patient who is confirmed to have, or being evaluated for, COVID-19  infection. After the person uses these items, you should wash them thoroughly with soap and water.  Wash laundry thoroughly Immediately remove and wash clothes or bedding that have blood, body fluids, and/or secretions or excretions, such as sweat, saliva, sputum, nasal mucus, vomit, urine, or feces, on them. Wear gloves when handling laundry from the patient. Read and follow directions on labels of laundry or clothing items and detergent. In general, wash and dry with the warmest temperatures recommended on the label.  Clean all areas the individual has used often Clean all touchable surfaces, such as counters, tabletops, doorknobs, bathroom fixtures, toilets, phones, keyboards, tablets, and bedside tables, every day. Also, clean any surfaces that may have blood, body fluids, and/or secretions or excretions on them. Wear gloves when cleaning surfaces the patient has come in contact with. Use a diluted bleach solution (e.g., dilute bleach with 1 part bleach and 10 parts water) or a household disinfectant with a label that says EPA-registered for coronaviruses. To make a bleach solution at home, add 1 tablespoon of bleach to 1 quart (4 cups) of water. For a larger supply, add  cup of bleach to 1 gallon (16 cups) of water. Read labels of cleaning products and follow recommendations provided on product labels. Labels contain instructions for safe and effective use of the cleaning product including precautions you should take when applying the product, such as wearing gloves or eye protection and making sure you have good ventilation during use of the product. Remove gloves and wash hands immediately after cleaning.  Monitor yourself for signs and symptoms of illness Caregivers and household members are considered close contacts, should monitor their health, and will be asked to limit movement outside of the home to the extent possible. Follow the monitoring steps for close contacts listed on  the symptom monitoring form.   ? If you have additional questions, contact your local health department or call the epidemiologist on call at [phone] (available 24/7). ? This guidance is subject to change. For the most up-to-date guidance from [facility], please refer to their website: TripMetro.huhttps://www.cdc.gov/coronavirus/2019-ncov/hcp/guidance-prevent-spread.html

## 2018-05-31 NOTE — ED Provider Notes (Signed)
[facility] Emergency Department Provider Note  ____________________________________________   First MD Initiated Contact with Patient 05/31/18 [phone]     (approximate)  I have reviewed the triage vital signs and the nursing notes.   HISTORY  Chief Complaint Sore Throat    HPI Joseph Keller is a 23 y.o. male with chronic medical history as listed below who presents for evaluation of sore throat that is been persistent over the last 3 days.  He describes it as moderate to severe.  It hurts worse when he drinks and eats things, better at rest.  He does not feel like he is swollen and he has no trouble speaking or swallowing.  He denies fever/chills, body aches, nasal congestion/runny nose, chest pain, shortness of breath, cough, nausea, vomiting, abdominal pain, and diarrhea.  He came in because he is concerned about the COVID-19 pandemic any her the sore throat can be a symptom.  He was laid off of work about a month and a half ago and has been isolating himself due to the pandemic.           Past Medical History:  Diagnosis Date  . ADHD (attention deficit hyperactivity disorder)   . Asthma     Patient Active Problem List   Diagnosis Date Noted  . 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 Other affective psychosis   . MDD (major depressive disorder) 02/27/2015    Past Surgical History:  Procedure Laterality Date  . TONSILLECTOMY      Prior to Admission medications   Medication Sig Start Date End Date Taking? Authorizing Provider  albuterol (PROVENTIL HFA;VENTOLIN HFA) 108 (90 Base) MCG/ACT inhaler Inhale 2 puffs into the lungs every 6 (six) hours as needed for wheezing or shortness of breath. 02/14/17   Willy Eddy, MD  ciprofloxacin (CIPRO) 500 MG tablet Take 1 tablet (500 mg total) by mouth 2 (two) times daily. 07/17/16   Jene Every, MD  divalproex (DEPAKOTE) 250 MG DR tablet Take 250 mg by  mouth 2 (two) times daily. 04/27/16   [provider]  escitalopram (LEXAPRO) 10 MG tablet Take 1 tablet (10 mg total) by mouth at bedtime. 03/05/15   Thedora Hinders, MD  ibuprofen (ADVIL,MOTRIN) 600 MG tablet Take 1 tablet by mouth three times daily with meals 05/15/16   Loleta Rose, MD  naproxen (NAPROSYN) 500 MG tablet Take 1 tablet (500 mg total) by mouth 2 (two) times daily with a meal. 01/25/17   Jene Every, MD  ondansetron (ZOFRAN ODT) 4 MG disintegrating tablet Take 1 tablet (4 mg total) by mouth every 8 (eight) hours as needed for nausea or vomiting. 04/25/18   Emily Filbert, MD  polyethylene glycol (MIRALAX / Ethelene Hal) packet Take 17 g by mouth daily as needed for moderate constipation. 04/25/18   Emily Filbert, MD  prochlorperazine (COMPAZINE) 10 MG tablet Take 1 tablet (10 mg total) by mouth every 6 (six) hours as needed for nausea (headache). 05/21/17   Irean Hong, MD    Allergies Patient has no known allergies.  No family history on file.  Social History Social History   Tobacco Use  . Smoking status: Current Every Day Smoker    Types: E-cigarettes  . Smokeless tobacco: Never Used  . Tobacco comment: Pt reports e-cigarette use "around 5 times a day"  Substance Use Topics  . Alcohol use: No  . Drug use: No    Review of Systems Constitutional: No  fever/chills Eyes: No visual changes. ENT: +sore throat. Cardiovascular: Denies chest pain. Respiratory: Denies shortness of breath. Gastrointestinal: No abdominal pain.  No nausea, no vomiting.  No diarrhea.  No constipation. Genitourinary: Negative for dysuria. Musculoskeletal: Negative for neck pain.  Negative for back pain. Integumentary: Negative for rash. Neurological: Negative for headaches, focal weakness or numbness.   ____________________________________________   PHYSICAL EXAM:  VITAL SIGNS: ED Triage Vitals  Enc Vitals Group     BP 05/31/18 0416 129/82     Pulse  Rate 05/31/18 0416 78     Resp 05/31/18 0416 17     Temp 05/31/18 0416 98.4 F (36.9 C)     Temp Source 05/31/18 0416 Oral     SpO2 05/31/18 0416 100 %     Weight 05/31/18 0411 61.2 kg (135 lb)     Height 05/31/18 0411 1.524 m (5')     Head Circumference --      Peak Flow --      Pain Score 05/31/18 0411 8     Pain Loc --      Pain Edu? --      Excl. in GC? --     Constitutional: Alert and oriented. Well appearing and in no acute distress. Eyes: Conjunctivae are normal.  Head: Atraumatic. Nose: No congestion/rhinnorhea. Mouth/Throat: Mucous membranes are moist.  Oropharynx non-erythematous.  No exudate, no petechiae on the palate.  No evidence of peritonsillar abscess or Ludwig's angina. Neck: No stridor.  No meningeal signs.  No cervical lymphadenopathy. Cardiovascular: Normal rate, regular rhythm. Good peripheral circulation. Grossly normal heart sounds. Respiratory: Normal respiratory effort.  No retractions. No audible wheezing. Gastrointestinal: Soft and nontender. No distention.  Musculoskeletal: No lower extremity tenderness nor edema. No gross deformities of extremities. Neurologic:  Normal speech and language. No gross focal neurologic deficits are appreciated.  Skin:  Skin is warm, dry and intact. No rash noted. Psychiatric: Mood and affect are normal.  The patient seems to have a slight speech impediment but this apparently is his baseline and does not seem to be the result of his sore throat or a neck infection.  ____________________________________________   LABS (all labs ordered are listed, but only abnormal results are displayed)  Labs Reviewed  GROUP A STREP BY PCR  NOVEL CORONAVIRUS, NAA (HOSPITAL ORDER, SEND-OUT TO REF LAB)   ____________________________________________  EKG  No indication for EKG ____________________________________________  RADIOLOGY   ED MD interpretation: No indication for imaging  Official radiology report(s): No results  found.  ____________________________________________   PROCEDURES   Procedure(s) performed (including Critical Care):  Procedures   ____________________________________________   INITIAL IMPRESSION / MDM / ASSESSMENT AND PLAN / ED COURSE  As part of my medical decision making, I reviewed the following data within the electronic MEDICAL RECORD NUMBER Nursing notes reviewed and incorporated, Labs reviewed , Old chart reviewed, Notes from prior ED visits and  Controlled Substance Database      *[place] Anglin was evaluated in Emergency Department on 05/31/2018 for the symptoms described in the history of present illness. He was evaluated in the context of the global COVID-19 pandemic, which necessitated consideration that the patient might be at risk for infection with the SARS-CoV-2 virus that causes COVID-19. Institutional protocols and algorithms that pertain to the evaluation of patients at risk for COVID-19 are in a state of rapid change based on information released by regulatory bodies including the CDC and federal and state organizations. These policies and algorithms were followed during the patient's  care in the ED.*  Differential diagnosis includes, but is not limited to, viral pharyngitis, bacterial pharyngitis including strep, Ludwig's angina, peritonsillar abscess, retropharyngeal abscess, epiglottitis, COVID-19.  The patient is well-appearing and in no distress.  He allegedly has been isolating himself.  I doubt that he has COVID-19 but given the current pandemic and the need for surveillance testing, I am sending the send out swab to [facility].  I am giving him the isolation restrictions as recommended by the CDC.  He understands he will likely only hear back about a positive result.  I gave my usual and customary return precautions.      ____________________________________________  FINAL CLINICAL IMPRESSION(S) / ED DIAGNOSES  Final diagnoses:  Sore throat     MEDICATIONS  GIVEN DURING THIS VISIT:  Medications - No data to display   ED Discharge Orders    None       Note:  This document was prepared using Dragon voice recognition software and may include unintentional dictation errors.   Loleta Rose, MD 05/31/18 [phone]

## 2018-05-31 NOTE — ED Triage Notes (Signed)
Patient ambulatory to triage with steady gait, without difficulty or distress noted; pt reports sore throat x 3 days with no accomp symptoms

## 2018-06-01 LAB — NOVEL CORONAVIRUS, NAA (HOSP ORDER, SEND-OUT TO REF LAB; TAT 18-24 HRS): SARS-CoV-2, NAA: NOT DETECTED

## 2018-06-02 ENCOUNTER — Telehealth: Payer: Self-pay | Admitting: Emergency Medicine

## 2018-06-02 NOTE — ED Notes (Signed)
Pt called back for results after receiving a message from Viviano Simas RN

## 2018-06-02 NOTE — Telephone Encounter (Signed)
Called patient due to lwot to inquire about condition and follow up plans. Left message.   

## 2018-07-13 ENCOUNTER — Other Ambulatory Visit: Payer: Self-pay

## 2018-07-13 ENCOUNTER — Emergency Department: Payer: BC Managed Care – PPO

## 2018-07-13 ENCOUNTER — Emergency Department
Admission: EM | Admit: 2018-07-13 | Discharge: 2018-07-13 | Disposition: A | Discharge status: Home / Self Care | Payer: BC Managed Care – PPO | Attending: Emergency Medicine | Admitting: Emergency Medicine

## 2018-07-13 DIAGNOSIS — S62629A Displaced fracture of medial phalanx of unspecified finger, initial encounter for closed fracture: Secondary | ICD-10-CM

## 2018-07-13 DIAGNOSIS — J45909 Unspecified asthma, uncomplicated: Secondary | ICD-10-CM | POA: Diagnosis not present

## 2018-07-13 DIAGNOSIS — Y929 Unspecified place or not applicable: Secondary | ICD-10-CM | POA: Diagnosis not present

## 2018-07-13 DIAGNOSIS — Y9389 Activity, other specified: Secondary | ICD-10-CM | POA: Diagnosis not present

## 2018-07-13 DIAGNOSIS — W231XXA Caught, crushed, jammed, or pinched between stationary objects, initial encounter: Secondary | ICD-10-CM | POA: Diagnosis not present

## 2018-07-13 DIAGNOSIS — S6991XA Unspecified injury of right wrist, hand and finger(s), initial encounter: Secondary | ICD-10-CM | POA: Diagnosis present

## 2018-07-13 DIAGNOSIS — Z79899 Other long term (current) drug therapy: Secondary | ICD-10-CM | POA: Insufficient documentation

## 2018-07-13 DIAGNOSIS — F909 Attention-deficit hyperactivity disorder, unspecified type: Secondary | ICD-10-CM | POA: Insufficient documentation

## 2018-07-13 DIAGNOSIS — Y990 Civilian activity done for income or pay: Secondary | ICD-10-CM | POA: Diagnosis not present

## 2018-07-13 DIAGNOSIS — S62660A Nondisplaced fracture of distal phalanx of right index finger, initial encounter for closed fracture: Secondary | ICD-10-CM | POA: Insufficient documentation

## 2018-07-13 DIAGNOSIS — F17290 Nicotine dependence, other tobacco product, uncomplicated: Secondary | ICD-10-CM | POA: Insufficient documentation

## 2018-07-13 MED ORDER — NAPROXEN 500 MG PO TABS
500.0000 mg | ORAL_TABLET | Freq: Once | ORAL | Status: AC
  Administered 2018-07-13: 23:00:00 500 mg via ORAL
  Filled 2018-07-13: qty 1

## 2018-07-13 NOTE — ED Provider Notes (Signed)
[facility] Emergency Department Provider Note ____________________________________________  Time seen: Approximately 11:11 PM  I have reviewed the triage vital signs and the nursing notes.   HISTORY  Chief Complaint Finger Injury    HPI Martinique Schmader is a 23 y.o. male who presents to the emergency department for evaluation and treatment of right index finger pain after jamming his finger while taking out the trash at work and then fell.  He caught himself against the ground. 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  Finger has been painful and swollen since then.  No alleviating measures attempted prior to arrival.  Past Medical History:  Diagnosis Date  . ADHD (attention deficit hyperactivity disorder)   . Asthma     Patient Active Problem List   Diagnosis Date Noted  . MDD (major depressive disorder), recurrent, severe, with psychosis ([place]) 02/28/2015  . Other affective psychosis   . MDD (major depressive disorder) 02/27/2015    Past Surgical History:  Procedure Laterality Date  . TONSILLECTOMY      Prior to Admission medications   Medication Sig Start Date End Date Taking? Authorizing Provider  albuterol (PROVENTIL HFA;VENTOLIN HFA) 108 (90 Base) MCG/ACT inhaler Inhale 2 puffs into the lungs every 6 (six) hours as needed for wheezing or shortness of breath. 02/14/17   Merlyn Lot, MD  ciprofloxacin (CIPRO) 500 MG tablet Take 1 tablet (500 mg total) by mouth 2 (two) times daily. 07/17/16   Lavonia Drafts, MD  divalproex (DEPAKOTE) 250 MG DR tablet Take 250 mg by mouth 2 (two) times daily. 04/27/16   [provider]  escitalopram (LEXAPRO) 10 MG tablet Take 1 tablet (10 mg total) by mouth at bedtime. 03/05/15   Philipp Ovens, MD  ibuprofen (ADVIL,MOTRIN) 600 MG tablet Take 1 tablet by mouth three times daily with meals 05/15/16   Hinda Kehr, MD  naproxen (NAPROSYN) 500  MG tablet Take 1 tablet (500 mg total) by mouth 2 (two) times daily with a meal. 01/25/17   Lavonia Drafts, MD  ondansetron (ZOFRAN ODT) 4 MG disintegrating tablet Take 1 tablet (4 mg total) by mouth every 8 (eight) hours as needed for nausea or vomiting. 04/25/18   Earleen Newport, MD  polyethylene glycol (MIRALAX / Floria Raveling) packet Take 17 g by mouth daily as needed for moderate constipation. 04/25/18   Earleen Newport, MD  prochlorperazine (COMPAZINE) 10 MG tablet Take 1 tablet (10 mg total) by mouth every 6 (six) hours as needed for nausea (headache). 05/21/17   Paulette Blanch, MD    Allergies Patient has no known allergies.  No family history on file.  Social History Social History   Tobacco Use  . Smoking status: Current Every Day Smoker    Types: E-cigarettes  . Smokeless tobacco: Never Used  . Tobacco comment: Pt reports e-cigarette use "around 5 times a day"  Substance Use Topics  . Alcohol use: No  . Drug use: No    Review of Systems Constitutional: Negative for fever. Cardiovascular: Negative for chest pain. Respiratory: Negative for shortness of breath. Musculoskeletal: Positive for right index finger pain. Skin: Positive for swelling of the right index finger. Neurological: Negative for decrease in sensation  ____________________________________________   PHYSICAL EXAM:  VITAL SIGNS: ED Triage Vitals  Enc Vitals Group     BP 07/13/18 2025 (!) 110/92     Pulse Rate 07/13/18 2025 89     Resp  07/13/18 2025 16     Temp 07/13/18 2025 98 F (36.7 C)     Temp Source 07/13/18 2025 Oral     SpO2 07/13/18 2025 98 %     Weight 07/13/18 2026 130 lb (59 kg)     Height 07/13/18 2026 5\' 1"  (1.549 m)     Head Circumference --      Peak Flow --      Pain Score 07/13/18 2026 9     Pain Loc --      Pain Edu? --      Excl. in GC? --     Constitutional: Alert and oriented. Well appearing and in no acute distress. Eyes: Conjunctivae are clear without discharge or  drainage Head: Atraumatic Neck: Nontraumatic, supple Respiratory: No cough. Respirations are even and unlabored. Musculoskeletal: Limited flexion of the right index finger secondary to swelling and pain. Neurologic: Motor and sensory function of the right index finger is intact. Skin: Mild swelling of the right index finger with ecchymosis over the volar aspect at the PIP Psychiatric: Affect and behavior are appropriate.  ____________________________________________   LABS (all labs ordered are listed, but only abnormal results are displayed)  Labs Reviewed - No data to display ____________________________________________  RADIOLOGY  Slight irregularity involving the volar plate of the second digit. ____________________________________________   PROCEDURES  Procedures  ____________________________________________   INITIAL IMPRESSION / ASSESSMENT AND PLAN / ED COURSE  [place] Herm[place] is a [age] y.o. who presents to the emergency department for treatment and evaluation of right index finger pain.  Patient has right-hand dominant.  X-ray does show a very small irregularity at the volar plate of the second digit at the PIP.  Static finger splint was applied and the patient will be treated with Naprosyn.  Work note will be provided for tomorrow.  And he is to have a repeat x-ray in approximately 2 weeks.  Information for orthopedics will be given.  Medications  naproxen (NAPROSYN) tablet 500 mg (500 mg Oral Given 07/13/18 2319)    Pertinent labs & imaging results that were available during my care of the patient were reviewed by me and considered in my medical decision making (see chart for details).  _________________________________________   FINAL CLINICAL IMPRESSION(S) / ED DIAGNOSES  Final diagnoses:  Closed avulsion fracture of middle phalanx of finger, initial encounter    ED Discharge Orders    None       If controlled substance prescribed during this visit, 12  month history viewed on the [facility] prior to issuing an initial prescription for Schedule II or III opiod.   Chinita Pesterriplett, Kaedin Hicklin B, FNP 07/13/18 2322    Sharman CheekStafford, Phillip, MD 07/13/18 2352

## 2018-07-13 NOTE — Discharge Instructions (Signed)
Please call and schedule follow-up appointment with orthopedics in approximately 2 weeks.  Wear the splint until follow-up.  You may take ibuprofen or Tylenol if needed for pain.

## 2018-07-13 NOTE — ED Triage Notes (Signed)
Pt states injured right index finger x3 today at work. Pt declines to file worker's comp. No obvious injury noted, no swelling noted. Pt is able to move finger.

## 2018-07-13 NOTE — ED Notes (Signed)
Pt reports jamming finger while taking taking out trash at work and fell catching self against the ground, right index finger appears swollen and bruised with minimal movement on command, CMS intact, pt works at [employer]  Pt then reports while cleaning car jammed the finger again under a seat, pt reports is on the schedule to work tomorrow

## 2018-12-25 ENCOUNTER — Other Ambulatory Visit: Payer: Self-pay

## 2018-12-25 ENCOUNTER — Emergency Department
Admission: EM | Admit: 2018-12-25 | Discharge: 2018-12-25 | Disposition: A | Discharge status: Home / Self Care | Payer: PRIVATE HEALTH INSURANCE | Attending: Emergency Medicine | Admitting: Emergency Medicine

## 2018-12-25 ENCOUNTER — Emergency Department: Payer: PRIVATE HEALTH INSURANCE

## 2018-12-25 ENCOUNTER — Encounter: Payer: Self-pay | Admitting: *Deleted

## 2018-12-25 DIAGNOSIS — M25561 Pain in right knee: Secondary | ICD-10-CM | POA: Diagnosis present

## 2018-12-25 DIAGNOSIS — Z79899 Other long term (current) drug therapy: Secondary | ICD-10-CM | POA: Insufficient documentation

## 2018-12-25 DIAGNOSIS — J45909 Unspecified asthma, uncomplicated: Secondary | ICD-10-CM | POA: Insufficient documentation

## 2018-12-25 DIAGNOSIS — F17290 Nicotine dependence, other tobacco product, uncomplicated: Secondary | ICD-10-CM | POA: Insufficient documentation

## 2018-12-25 DIAGNOSIS — M76891 Other specified enthesopathies of right lower limb, excluding foot: Secondary | ICD-10-CM | POA: Insufficient documentation

## 2018-12-25 DIAGNOSIS — M76899 Other specified enthesopathies of unspecified lower limb, excluding foot: Secondary | ICD-10-CM

## 2018-12-25 MED ORDER — MELOXICAM 15 MG PO TABS: 15.0000 mg | ORAL_TABLET | Freq: Every day | ORAL | 1 refills | Status: AC

## 2018-12-25 NOTE — Discharge Instructions (Addendum)
Take meloxicam daily. Apply ice to back of knee. Avoid reaching activities at work.

## 2018-12-25 NOTE — ED Triage Notes (Signed)
Right knee pain for about a month, denies injury. Pt says he has been using a knee brace, tylenol and ibuprofen.

## 2018-12-25 NOTE — ED Provider Notes (Signed)
[facility] Emergency Department Provider Note  ____________________________________________  Time seen: Approximately 11:27 PM  I have reviewed the triage vital signs and the nursing notes.   HISTORY  Chief Complaint Knee Pain   Historian Patient     HPI Joseph Keller is a 23 y.o. male with a history of ADHD and asthma, presents to the emergency department with right posterior knee pain.  Patient localizes his pain over the insertion for the hamstring.  He has tenderness when he touches insertion for hamstring.  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  He is also been doing yoga and other at home exercises with his wife who recently lost over 100 pounds.  Patient denies redness or warmth of the right lower extremity.  No recent travel, prolonged immobilization or daily surgery.  He is not a daily smoker.  Patient states that he has been wearing a knee brace occasionally for pain.   Past Medical History:  Diagnosis Date  . ADHD (attention deficit hyperactivity disorder)   . Asthma      Immunizations up to date:  Yes.     Past Medical History:  Diagnosis Date  . ADHD (attention deficit hyperactivity disorder)   . Asthma     Patient Active Problem List   Diagnosis Date Noted  . 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 Other affective psychosis   . MDD (major depressive disorder) 02/27/2015    Past Surgical History:  Procedure Laterality Date  . TONSILLECTOMY      Prior to Admission medications   Medication Sig Start Date End Date Taking? Authorizing Provider  albuterol (PROVENTIL HFA;VENTOLIN HFA) 108 (90 Base) MCG/ACT inhaler Inhale 2 puffs into the lungs every 6 (six) hours as needed for wheezing or shortness of breath. 02/14/17   Willy Eddy, MD  ciprofloxacin (CIPRO) 500 MG tablet Take 1 tablet (500 mg total) by mouth 2  (two) times daily. 07/17/16   Jene Every, MD  divalproex (DEPAKOTE) 250 MG DR tablet Take 250 mg by mouth 2 (two) times daily. 04/27/16   [provider]  escitalopram (LEXAPRO) 10 MG tablet Take 1 tablet (10 mg total) by mouth at bedtime. 03/05/15   Thedora Hinders, MD  ibuprofen (ADVIL,MOTRIN) 600 MG tablet Take 1 tablet by mouth three times daily with meals 05/15/16   Loleta Rose, MD  meloxicam (MOBIC) 15 MG tablet Take 1 tablet (15 mg total) by mouth daily for 7 days. 12/25/18 01/01/19  Orvil Feil, PA-C  naproxen (NAPROSYN) 500 MG tablet Take 1 tablet (500 mg total) by mouth 2 (two) times daily with a meal. 01/25/17   Jene Every, MD  ondansetron (ZOFRAN ODT) 4 MG disintegrating tablet Take 1 tablet (4 mg total) by mouth every 8 (eight) hours as needed for nausea or vomiting. 04/25/18   Emily Filbert, MD  polyethylene glycol (MIRALAX / Ethelene Hal) packet Take 17 g by mouth daily as needed for moderate constipation. 04/25/18   Emily Filbert, MD  prochlorperazine (COMPAZINE) 10 MG tablet Take 1 tablet (10 mg total) by mouth every 6 (six) hours as needed for nausea (headache). 05/21/17   Irean Hong, MD    Allergies Patient has no known allergies.  No family history on file.  Social History Social History   Tobacco Use  . Smoking status: Current Every Day Smoker    Types: E-cigarettes  . Smokeless  tobacco: Never Used  . Tobacco comment: Pt reports e-cigarette use "around 5 times a day"  Substance Use Topics  . Alcohol use: No  . Drug use: No     Review of Systems  Constitutional: No fever/chills Eyes:  No discharge ENT: No upper respiratory complaints. Respiratory: no cough. No SOB/ use of accessory muscles to breath Gastrointestinal:   No nausea, no vomiting.  No diarrhea.  No constipation. Musculoskeletal: patient has right knee pain.  Skin: Negative for rash, abrasions, lacerations,  ecchymosis.   ____________________________________________   PHYSICAL EXAM:  VITAL SIGNS: ED Triage Vitals  Enc Vitals Group     BP 12/25/18 1956 133/78     Pulse Rate 12/25/18 1956 68     Resp 12/25/18 1956 16     Temp 12/25/18 1956 98.5 F (36.9 C)     Temp src --      SpO2 12/25/18 1956 100 %     Weight --      Height --      Head Circumference --      Peak Flow --      Pain Score 12/25/18 1957 8     Pain Loc --      Pain Edu? --      Excl. in GC? --      Constitutional: Alert and oriented. Well appearing and in no acute distress. Eyes: Conjunctivae are normal. PERRL. EOMI. Head: Atraumatic. ENT: Cardiovascular: Normal rate, regular rhythm. Normal S1 and S2.  Good peripheral circulation. Respiratory: Normal respiratory effort without tachypnea or retractions. Lungs CTAB. Good air entry to the bases with no decreased or absent breath sounds Gastrointestinal: Bowel sounds x 4 quadrants. Soft and nontender to palpation. No guarding or rigidity. No distention. Musculoskeletal: Full range of motion to all extremities. No obvious deformities noted.  No deficits noted with provocative testing of the right knee.  Patient has tenderness to palpation over insertion of right hamstring.  No edema or erythema of the right lower extremity.  No calf pain to palpation.  Palpable dorsalis pedis pulse, right. Neurologic:  Normal for age. No gross focal neurologic deficits are appreciated.  Skin:  Skin is warm, dry and intact. No rash noted. Psychiatric: Mood and affect are normal for age. Speech and behavior are normal.   ____________________________________________   LABS (all labs ordered are listed, but only abnormal results are displayed)  Labs Reviewed - No data to display ____________________________________________  EKG   ____________________________________________  RADIOLOGY I personally viewed and evaluated these images as part of my medical decision making, as well  as reviewing the written report by the radiologist.  Dg Knee Complete 4 Views Right  Result Date: 12/25/2018 CLINICAL DATA:  Pain for approximately 1 month EXAM: RIGHT KNEE - COMPLETE 4+ VIEW COMPARISON:  None. FINDINGS: Frontal, lateral, and bilateral oblique views were obtained. No evident fracture or dislocation. No joint effusion. Joint spaces appear normal. No erosive change. IMPRESSION: No fracture or dislocation. No joint effusion. No evident arthropathy. Electronically Signed   By: Bretta BangWilliam  Woodruff III M.D.   On: 12/25/2018 20:22    ____________________________________________    PROCEDURES  Procedure(s) performed:     Procedures     Medications - No data to display   ____________________________________________   INITIAL IMPRESSION / ASSESSMENT AND PLAN / ED COURSE  Pertinent labs & imaging results that were available during my care of the patient were reviewed by me and considered in my medical decision making (see chart for details).  Assessment and plan Hamstring tendinitis 23 year old male presents to the emergency department with right posterior knee pain that is occurred intermittently for the past month.  On physical exam, patient has tenderness to palpation over insertion for right hamstring consistent with hamstring tendinitis.  Hamstring strengthening exercises were given in patient's discharge paperwork he was started on meloxicam.  Recommended applying ice to the affected area.  He was advised to follow-up with orthopedics.  All patient questions were answered.     ____________________________________________  FINAL CLINICAL IMPRESSION(S) / ED DIAGNOSES  Final diagnoses:  Hamstring tendonitis      NEW MEDICATIONS STARTED DURING THIS VISIT:  ED Discharge Orders         Ordered    meloxicam (MOBIC) 15 MG tablet  Daily     12/25/18 2241              This chart was dictated using voice recognition software/Dragon. Despite best  efforts to proofread, errors can occur which can change the meaning. Any change was purely unintentional.     Lannie Fields, PA-C 12/25/18 2332    Arta Silence, MD 12/28/18 [phone]

## 2019-02-15 ENCOUNTER — Ambulatory Visit: Payer: PRIVATE HEALTH INSURANCE | Attending: Family

## 2019-02-15 ENCOUNTER — Other Ambulatory Visit: Payer: Self-pay

## 2019-02-15 DIAGNOSIS — Z20822 Contact with and (suspected) exposure to covid-19: Secondary | ICD-10-CM

## 2019-02-17 LAB — NOVEL CORONAVIRUS, NAA: SARS-CoV-2, NAA: NOT DETECTED

## 2019-02-27 ENCOUNTER — Other Ambulatory Visit: Payer: Self-pay

## 2019-03-06 ENCOUNTER — Ambulatory Visit: Payer: PRIVATE HEALTH INSURANCE | Attending: Internal Medicine

## 2019-03-06 DIAGNOSIS — Z20822 Contact with and (suspected) exposure to covid-19: Secondary | ICD-10-CM

## 2019-03-07 LAB — NOVEL CORONAVIRUS, NAA: SARS-CoV-2, NAA: NOT DETECTED

## 2019-05-27 ENCOUNTER — Encounter: Payer: Self-pay | Admitting: Emergency Medicine

## 2019-05-27 ENCOUNTER — Other Ambulatory Visit: Payer: Self-pay

## 2019-05-27 ENCOUNTER — Emergency Department
Admission: EM | Admit: 2019-05-27 | Discharge: 2019-05-28 | Disposition: A | Discharge status: Home / Self Care | Payer: PRIVATE HEALTH INSURANCE | Attending: Emergency Medicine | Admitting: Emergency Medicine

## 2019-05-27 DIAGNOSIS — R519 Headache, unspecified: Secondary | ICD-10-CM | POA: Diagnosis not present

## 2019-05-27 DIAGNOSIS — Z5321 Procedure and treatment not carried out due to patient leaving prior to being seen by health care provider: Secondary | ICD-10-CM | POA: Diagnosis not present

## 2019-05-27 DIAGNOSIS — R0981 Nasal congestion: Secondary | ICD-10-CM | POA: Insufficient documentation

## 2019-05-27 NOTE — ED Triage Notes (Addendum)
Patient ambulatory to triage with steady gait, without difficulty or distress noted, mask in place; pt reports since yesterday migraine, located to temples; st recent nasal stuffiness "due to pollen"

## 2019-07-23 ENCOUNTER — Encounter: Payer: Self-pay | Admitting: Emergency Medicine

## 2019-07-23 ENCOUNTER — Emergency Department
Admission: EM | Admit: 2019-07-23 | Discharge: 2019-07-23 | Disposition: A | Discharge status: Home / Self Care | Payer: PRIVATE HEALTH INSURANCE | Attending: Emergency Medicine | Admitting: Emergency Medicine

## 2019-07-23 ENCOUNTER — Other Ambulatory Visit: Payer: Self-pay

## 2019-07-23 DIAGNOSIS — F17200 Nicotine dependence, unspecified, uncomplicated: Secondary | ICD-10-CM | POA: Insufficient documentation

## 2019-07-23 DIAGNOSIS — F909 Attention-deficit hyperactivity disorder, unspecified type: Secondary | ICD-10-CM | POA: Insufficient documentation

## 2019-07-23 DIAGNOSIS — J029 Acute pharyngitis, unspecified: Secondary | ICD-10-CM

## 2019-07-23 DIAGNOSIS — Z79899 Other long term (current) drug therapy: Secondary | ICD-10-CM | POA: Insufficient documentation

## 2019-07-23 MED ORDER — ACETAMINOPHEN-CODEINE #3 300-30 MG PO TABS: 1.0000 | ORAL_TABLET | ORAL | 0 refills | Status: DC | PRN

## 2019-07-23 MED ORDER — AMOXICILLIN 500 MG PO TABS: 500.0000 mg | ORAL_TABLET | Freq: Three times a day (TID) | ORAL | 0 refills | Status: DC

## 2019-07-23 MED ORDER — PREDNISONE 10 MG (21) PO TBPK: ORAL_TABLET | ORAL | 0 refills | Status: DC

## 2019-07-23 NOTE — Discharge Instructions (Signed)
Please follow up with the primary care provider for symptoms that are not improving over the next few days.  Return to the ER for symptoms that change or worsen if unable to schedule an appointment.

## 2019-07-23 NOTE — ED Triage Notes (Signed)
Patient reports sore throat since last night. Hurts to swallow. Denies fever.

## 2019-07-23 NOTE — ED Provider Notes (Signed)
[facility] Emergency Department Provider Note  ____________________________________________  Time seen: Approximately 4:08 PM  I have reviewed the triage vital signs and the nursing notes.   HISTORY  Chief Complaint Sore Throat    HPI Joseph Keller is a 24 y.o. male presents to the emergency department for treatment and evaluation of sore throat for the past couple of days.  He has had similar symptoms in the past and was diagnosed with pharyngitis and put on some antibiotics.  No known fever.  Pain increases with swallowing.   Past Medical History:  Diagnosis Date  . ADHD (attention deficit hyperactivity disorder)   . Asthma     Patient Active Problem List   Diagnosis Date Noted  . 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 Other affective psychosis   . MDD (major depressive disorder) 02/27/2015    Past Surgical History:  Procedure Laterality Date  . TONSILLECTOMY      Prior to Admission medications   Medication Sig Start Date End Date Taking? Authorizing Provider  acetaminophen-codeine (TYLENOL #3) 300-30 MG tablet Take 1-2 tablets by mouth every 4 (four) hours as needed for moderate pain. 07/23/19   Dalbert Stillings, Rulon Eisenmenger B, FNP  albuterol (PROVENTIL HFA;VENTOLIN HFA) 108 (90 Base) MCG/ACT inhaler Inhale 2 puffs into the lungs every 6 (six) hours as needed for wheezing or shortness of breath. 02/14/17   Willy Eddy, MD  amoxicillin (AMOXIL) 500 MG tablet Take 1 tablet (500 mg total) by mouth 3 (three) times daily. 07/23/19   Kelin Nixon, Rulon Eisenmenger B, FNP  divalproex (DEPAKOTE) 250 MG DR tablet Take 250 mg by mouth 2 (two) times daily. 04/27/16   [provider]  escitalopram (LEXAPRO) 10 MG tablet Take 1 tablet (10 mg total) by mouth at bedtime. 03/05/15   Thedora Hinders, MD  ibuprofen (ADVIL,MOTRIN) 600 MG tablet Take 1 tablet by mouth three times daily with meals 05/15/16   Loleta Rose, MD   naproxen (NAPROSYN) 500 MG tablet Take 1 tablet (500 mg total) by mouth 2 (two) times daily with a meal. 01/25/17   Jene Every, MD  ondansetron (ZOFRAN ODT) 4 MG disintegrating tablet Take 1 tablet (4 mg total) by mouth every 8 (eight) hours as needed for nausea or vomiting. 04/25/18   Emily Filbert, MD  polyethylene glycol (MIRALAX / Ethelene Hal) packet Take 17 g by mouth daily as needed for moderate constipation. 04/25/18   Emily Filbert, MD  predniSONE (STERAPRED UNI-PAK 21 TAB) 10 MG (21) TBPK tablet Take 6 tablets on the first day and decrease by 1 tablet each day until finished. 07/23/19   Hakiem Malizia, Rulon Eisenmenger B, FNP  prochlorperazine (COMPAZINE) 10 MG tablet Take 1 tablet (10 mg total) by mouth every 6 (six) hours as needed for nausea (headache). 05/21/17   Irean Hong, MD    Allergies Patient has no known allergies.  No family history on file.  Social History Social History   Tobacco Use  . Smoking status: Current Every Day Smoker    Types: E-cigarettes  . Smokeless tobacco: Never Used  . Tobacco comment: Pt reports e-cigarette use "around 5 times a day"  Substance Use Topics  . Alcohol use: No  . Drug use: No    Review of Systems Constitutional: Negative for fever. Eyes: No visual changes. ENT: Positive for sore throat; negative for difficulty swallowing. Respiratory: Denies shortness of breath. Gastrointestinal: Negative for abdominal pain.  No nausea, no vomiting.  No diarrhea.  Genitourinary: Negative for dysuria.  Negative for decrease in need to void. Musculoskeletal: Negative for generalized body aches. Skin: Negative for rash. Neurological: Negative for headaches, negative for focal weakness or numbness.  ____________________________________________   PHYSICAL EXAM:  VITAL SIGNS: ED Triage Vitals  Enc Vitals Group     BP 07/23/19 1540 136/80     Pulse Rate 07/23/19 1540 75     Resp 07/23/19 1540 16     Temp 07/23/19 1540 98.3 F (36.8 C)      Temp Source 07/23/19 1540 Oral     SpO2 07/23/19 1540 100 %     Weight 07/23/19 1542 132 lb 15 oz (60.3 kg)     Height 07/23/19 1542 5' (1.524 m)     Head Circumference --      Peak Flow --      Pain Score 07/23/19 1542 10     Pain Loc --      Pain Edu? --      Excl. in GC? --     Constitutional: Alert and oriented. Well appearing and in no acute distress. Eyes: Conjunctivae are normal.  Head: Atraumatic. Nose: No congestion/rhinnorhea. Mouth/Throat: Mucous membranes are moist.  Oropharynx erythematous, tonsils 2+ with exudate. Uvula is midline. Ears: Right tympanic membrane appears normal.  Left tympanic membrane appears normal. Neck: No stridor. Voice hoarse Lymphatic: Anterior cervical nodes tender and palpable Cardiovascular: Normal rate, regular rhythm. Good peripheral circulation. Respiratory: Normal respiratory effort. Lungs CTAB. Gastrointestinal: Soft and nontender. Musculoskeletal: FROM of neck, upper and lower extremities. Neurologic:  Normal speech and language. No gross focal neurologic deficits are appreciated. Skin:  Skin is warm, dry and intact.  No rash noted Psychiatric: Mood and affect are normal. Speech and behavior are normal.  ____________________________________________   LABS (all labs ordered are listed, but only abnormal results are displayed)  Labs Reviewed - No data to display ____________________________________________  EKG  Not indiated ____________________________________________  RADIOLOGY  Not indicated ____________________________________________   PROCEDURES  Procedure(s) performed: None  Critical Care performed: No ____________________________________________   INITIAL IMPRESSION / ASSESSMENT AND PLAN / ED COURSE  24 year old male presenting to the emergency department after 2 days of having sore throat that has been increasingly more painful today.  Symptoms and exam are most consistent with streptococcal pharyngitis.  He  will be treated with amoxicillin, prednisone, and Tylenol 3.  He will be instructed to follow-up with primary care provider for choice for symptoms that are not improving over the next few days.  If he is unable to schedule appointment he is to return to the emergency department.  Pertinent labs & imaging results that were available during my care of the patient were reviewed by me and considered in my medical decision making (see chart for details). ____________________________________________  New Prescriptions   ACETAMINOPHEN-CODEINE (TYLENOL #3) 300-30 MG TABLET    Take 1-2 tablets by mouth every 4 (four) hours as needed for moderate pain.   AMOXICILLIN (AMOXIL) 500 MG TABLET    Take 1 tablet (500 mg total) by mouth 3 (three) times daily.   PREDNISONE (STERAPRED UNI-PAK 21 TAB) 10 MG (21) TBPK TABLET    Take 6 tablets on the first day and decrease by 1 tablet each day until finished.    FINAL CLINICAL IMPRESSION(S) / ED DIAGNOSES  Final diagnoses:  Exudative pharyngitis    If controlled substance prescribed during this visit, 12 month history viewed on the [facility] prior to issuing an initial prescription for Schedule II or  III opiod.   Note:  This document was prepared using Dragon voice recognition software and may include unintentional dictation errors.   Victorino Dike, FNP 07/23/19 1645    Earleen Newport, MD 07/23/19 [phone]

## 2019-08-28 ENCOUNTER — Other Ambulatory Visit: Payer: Self-pay

## 2019-08-28 ENCOUNTER — Emergency Department
Admission: EM | Admit: 2019-08-28 | Discharge: 2019-08-28 | Discharge status: Absent without leave | Payer: PRIVATE HEALTH INSURANCE

## 2019-08-28 ENCOUNTER — Encounter: Payer: Self-pay | Admitting: Emergency Medicine

## 2019-08-28 ENCOUNTER — Emergency Department
Admission: EM | Admit: 2019-08-28 | Discharge: 2019-08-28 | Disposition: A | Discharge status: Home / Self Care | Payer: PRIVATE HEALTH INSURANCE | Attending: Emergency Medicine | Admitting: Emergency Medicine

## 2019-08-28 DIAGNOSIS — R112 Nausea with vomiting, unspecified: Secondary | ICD-10-CM | POA: Insufficient documentation

## 2019-08-28 DIAGNOSIS — Z5321 Procedure and treatment not carried out due to patient leaving prior to being seen by health care provider: Secondary | ICD-10-CM | POA: Insufficient documentation

## 2019-08-28 LAB — URINALYSIS, COMPLETE (UACMP) WITH MICROSCOPIC
Bacteria, UA: NONE SEEN
Bilirubin Urine: NEGATIVE
Glucose, UA: NEGATIVE mg/dL
Hgb urine dipstick: NEGATIVE
Ketones, ur: NEGATIVE mg/dL
Leukocytes,Ua: NEGATIVE
Nitrite: NEGATIVE
Protein, ur: NEGATIVE mg/dL
Specific Gravity, Urine: 1.002 — ABNORMAL LOW (ref 1.005–1.030)
Squamous Epithelial / HPF: NONE SEEN (ref 0–5)
pH: 6 (ref 5.0–8.0)

## 2019-08-28 MED ORDER — SODIUM CHLORIDE 0.9% FLUSH: 3.0000 mL | Freq: Once | INTRAVENOUS | Status: DC

## 2019-08-28 NOTE — ED Notes (Signed)
Patient refuses blood work in [employer].

## 2019-08-28 NOTE — ED Notes (Signed)
Pt reports is leaving and will return later

## 2019-08-28 NOTE — ED Triage Notes (Signed)
C/O N/V x 1 day.  Seen through [facility] ED yesterday, but vomiting has worsened overnight.  AAOx3.  Skin warm and dry. NAD

## 2019-08-29 ENCOUNTER — Encounter: Payer: Self-pay | Admitting: Emergency Medicine

## 2019-08-29 ENCOUNTER — Other Ambulatory Visit: Payer: Self-pay

## 2019-08-29 ENCOUNTER — Emergency Department: Payer: Self-pay

## 2019-08-29 ENCOUNTER — Emergency Department
Admission: EM | Admit: 2019-08-29 | Discharge: 2019-08-29 | Disposition: A | Discharge status: Home / Self Care | Payer: Self-pay | Attending: Emergency Medicine | Admitting: Emergency Medicine

## 2019-08-29 DIAGNOSIS — R1084 Generalized abdominal pain: Secondary | ICD-10-CM

## 2019-08-29 DIAGNOSIS — R1110 Vomiting, unspecified: Secondary | ICD-10-CM | POA: Insufficient documentation

## 2019-08-29 DIAGNOSIS — F17290 Nicotine dependence, other tobacco product, uncomplicated: Secondary | ICD-10-CM | POA: Insufficient documentation

## 2019-08-29 DIAGNOSIS — Z7951 Long term (current) use of inhaled steroids: Secondary | ICD-10-CM | POA: Insufficient documentation

## 2019-08-29 DIAGNOSIS — Z5321 Procedure and treatment not carried out due to patient leaving prior to being seen by health care provider: Secondary | ICD-10-CM | POA: Insufficient documentation

## 2019-08-29 DIAGNOSIS — J45909 Unspecified asthma, uncomplicated: Secondary | ICD-10-CM | POA: Insufficient documentation

## 2019-08-29 DIAGNOSIS — R2681 Unsteadiness on feet: Secondary | ICD-10-CM | POA: Insufficient documentation

## 2019-08-29 LAB — LIPASE, BLOOD: Lipase: 23 U/L (ref 11–51)

## 2019-08-29 LAB — URINALYSIS, COMPLETE (UACMP) WITH MICROSCOPIC
Bacteria, UA: NONE SEEN
Bilirubin Urine: NEGATIVE
Glucose, UA: NEGATIVE mg/dL
Hgb urine dipstick: NEGATIVE
Ketones, ur: 80 mg/dL — AB
Leukocytes,Ua: NEGATIVE
Nitrite: NEGATIVE
Protein, ur: 30 mg/dL — AB
Specific Gravity, Urine: 1.032 — ABNORMAL HIGH (ref 1.005–1.030)
Squamous Epithelial / HPF: NONE SEEN (ref 0–5)
pH: 5 (ref 5.0–8.0)

## 2019-08-29 LAB — COMPREHENSIVE METABOLIC PANEL
ALT: 24 U/L (ref 0–44)
AST: 17 U/L (ref 15–41)
Albumin: 5 g/dL (ref 3.5–5.0)
Alkaline Phosphatase: 72 U/L (ref 38–126)
Anion gap: 11 (ref 5–15)
BUN: 7 mg/dL (ref 6–20)
CO2: 27 mmol/L (ref 22–32)
Calcium: 9.6 mg/dL (ref 8.9–10.3)
Chloride: 102 mmol/L (ref 98–111)
Creatinine, Ser: 0.95 mg/dL (ref 0.61–1.24)
GFR calc Af Amer: >60 mL/min (ref >60)
GFR calc non Af Amer: >60 mL/min (ref >60)
Glucose, Bld: 129 mg/dL — ABNORMAL HIGH (ref 70–99)
Potassium: 3.8 mmol/L (ref 3.5–5.1)
Sodium: 140 mmol/L (ref 135–145)
Total Bilirubin: 1.9 mg/dL — ABNORMAL HIGH (ref 0.3–1.2)
Total Protein: 7.9 g/dL (ref 6.5–8.1)

## 2019-08-29 LAB — CBC
HCT: 41.6 % (ref 39.0–52.0)
Hemoglobin: 14.7 g/dL (ref 13.0–17.0)
MCH: 28.9 pg (ref 26.0–34.0)
MCHC: 35.3 g/dL (ref 30.0–36.0)
MCV: 81.9 fL (ref 80.0–100.0)
Platelets: 254 10*3/uL (ref 150–400)
RBC: 5.08 MIL/uL (ref 4.22–5.81)
RDW: 12.6 % (ref 11.5–15.5)
WBC: 8.4 10*3/uL (ref 4.0–10.5)
nRBC: 0 % (ref 0.0–0.2)

## 2019-08-29 MED ORDER — ONDANSETRON 4 MG PO TBDP
4.0000 mg | ORAL_TABLET | Freq: Once | ORAL | Status: AC
  Administered 2019-08-29: 4 mg via ORAL
  Filled 2019-08-29: qty 1

## 2019-08-29 MED ORDER — IOHEXOL 300 MG/ML  SOLN
100.0000 mL | Freq: Once | INTRAMUSCULAR | Status: AC | PRN
  Administered 2019-08-29: 100 mL via INTRAVENOUS
  Filled 2019-08-29: qty 100

## 2019-08-29 MED ORDER — SODIUM CHLORIDE 0.9% FLUSH: 3.0000 mL | Freq: Once | INTRAVENOUS | Status: DC

## 2019-08-29 MED ORDER — LACTATED RINGERS IV BOLUS
1000.0000 mL | Freq: Once | INTRAVENOUS | Status: AC
  Administered 2019-08-29: 1000 mL via INTRAVENOUS

## 2019-08-29 MED ORDER — ONDANSETRON HCL 4 MG PO TABS: 4.0000 mg | ORAL_TABLET | Freq: Three times a day (TID) | ORAL | 0 refills | Status: DC | PRN

## 2019-08-29 NOTE — ED Notes (Signed)
Pt ambulatory to bathroom with a steady gait

## 2019-08-29 NOTE — ED Triage Notes (Signed)
Pt presents to ED via POV, reports returns for abdominal pain, N/V that he was triaged for yesterday, states continued pain and symptoms at this time. A&O x4, NAD noted, VSS in triage.

## 2019-08-29 NOTE — ED Triage Notes (Signed)
Patient ambulatory to triage with steady gait, without difficulty or distress noted; pt reports N/V since Monday; st seen at [facility] on Monday and was here yesterday but left before seen due to long wait; pt refuses any protocols/blood draw at this time

## 2019-08-29 NOTE — ED Provider Notes (Signed)
[facility] Emergency Department Provider Note  ____________________________________________   First MD Initiated Contact with Patient 08/29/19 1151     (approximate)  I have reviewed the triage vital signs and the nursing notes.   HISTORY  Chief Complaint Abdominal Pain   HPI Joseph Keller is a 24 y.o. male who past medical history of ADHD and asthma who presents for assessment of 2 3 days of periumbilical abdominal pain associated with episodes of nonbloody nonbilious emesis.  Patient states his pain has been constant and he has had difficulty keeping anything down and has very poor p.o. intake.  He denies any fevers, chills, cough, shortness of breath, diarrhea, dysuria, back pain, rash, recent traumatic injuries, or other acute complaints.  No clear lesion aggravating factors.  No prior similar episodes.  Patient denies illegal drug use, tobacco abuse, or EtOH abuse.  No significant NSAID use.  No other acute complaints at this time.  No sick contacts or recent travel outside [place].         Past Medical History:  Diagnosis Date  . ADHD (attention deficit hyperactivity disorder)   . Asthma     Patient Active Problem List   Diagnosis Date Noted  . 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 Other affective psychosis   . MDD (major depressive disorder) 02/27/2015    Past Surgical History:  Procedure Laterality Date  . TONSILLECTOMY      Prior to Admission medications   Medication Sig Start Date End Date Taking? Authorizing Provider  acetaminophen-codeine (TYLENOL #3) 300-30 MG tablet Take 1-2 tablets by mouth every 4 (four) hours as needed for moderate pain. 07/23/19   Triplett, Rulon Eisenmenger B, FNP  albuterol (PROVENTIL HFA;VENTOLIN HFA) 108 (90 Base) MCG/ACT inhaler Inhale 2 puffs into the lungs every 6 (six) hours as needed for wheezing or shortness of breath. 02/14/17   Willy Eddy, MD    divalproex (DEPAKOTE) 250 MG DR tablet Take 250 mg by mouth 2 (two) times daily. 04/27/16   [provider]  escitalopram (LEXAPRO) 10 MG tablet Take 1 tablet (10 mg total) by mouth at bedtime. 03/05/15   Thedora Hinders, MD  ibuprofen (ADVIL,MOTRIN) 600 MG tablet Take 1 tablet by mouth three times daily with meals 05/15/16   Loleta Rose, MD  naproxen (NAPROSYN) 500 MG tablet Take 1 tablet (500 mg total) by mouth 2 (two) times daily with a meal. 01/25/17   Jene Every, MD  ondansetron (ZOFRAN) 4 MG tablet Take 1 tablet (4 mg total) by mouth every 8 (eight) hours as needed for up to 10 doses for nausea or vomiting. 08/29/19   Gilles Chiquito, MD    Allergies Patient has no known allergies.  History reviewed. No pertinent family history.  Social History Social History   Tobacco Use  . Smoking status: Current Every Day Smoker    Types: E-cigarettes  . Smokeless tobacco: Never Used  . Tobacco comment: Pt reports e-cigarette use "around 5 times a day"  Substance Use Topics  . Alcohol use: No  . Drug use: No    Review of Systems  Review of Systems  Constitutional: Negative for chills and fever.  HENT: Negative for sore throat.   Eyes: Negative for pain.  Respiratory: Negative for cough and stridor.   Cardiovascular: Negative for chest pain.  Gastrointestinal: Positive for abdominal pain, nausea and vomiting.  Skin: Negative for rash.  Neurological: Negative for seizures, loss of consciousness and  headaches.  Psychiatric/Behavioral: Negative for suicidal ideas.  All other systems reviewed and are negative.     ____________________________________________   PHYSICAL EXAM:  VITAL SIGNS: ED Triage Vitals  Enc Vitals Group     BP 08/29/19 0839 (!) 148/95     Pulse Rate 08/29/19 0839 83     Resp 08/29/19 0839 18     Temp 08/29/19 0839 98 F (36.7 C)     Temp Source 08/29/19 0839 Oral     SpO2 08/29/19 0839 98 %     Weight 08/29/19 0844 130 lb (59  kg)     Height 08/29/19 0844 5' (1.524 m)     Head Circumference --      Peak Flow --      Pain Score 08/29/19 0844 8     Pain Loc --      Pain Edu? --      Excl. in GC? --    Vitals:   08/29/19 0839  BP: (!) 148/95  Pulse: 83  Resp: 18  Temp: 98 F (36.7 C)  SpO2: 98%   Physical Exam Vitals and nursing note reviewed.  Constitutional:      Appearance: He is well-developed.  HENT:     Head: Normocephalic and atraumatic.     Right Ear: External ear normal.     Left Ear: External ear normal.     Nose: Nose normal.     Mouth/Throat:     Mouth: Mucous membranes are moist.  Eyes:     Conjunctiva/sclera: Conjunctivae normal.  Cardiovascular:     Rate and Rhythm: Normal rate and regular rhythm.     Heart sounds: No murmur heard.   Pulmonary:     Effort: Pulmonary effort is normal. No respiratory distress.     Breath sounds: Normal breath sounds.  Abdominal:     Palpations: Abdomen is soft.     Tenderness: There is abdominal tenderness in the periumbilical area. There is no right CVA tenderness or left CVA tenderness.  Musculoskeletal:     Cervical back: Neck supple.  Skin:    General: Skin is warm and dry.  Neurological:     Mental Status: He is alert and oriented to person, place, and time.  Psychiatric:        Mood and Affect: Mood normal.      ____________________________________________   LABS (all labs ordered are listed, but only abnormal results are displayed)  Labs Reviewed  COMPREHENSIVE METABOLIC PANEL - Abnormal; Notable for the following components:      Result Value   Glucose, Bld 129 (*)    Total Bilirubin 1.9 (*)    All other components within normal limits  URINALYSIS, COMPLETE (UACMP) WITH MICROSCOPIC - Abnormal; Notable for the following components:   Color, Urine YELLOW (*)    APPearance HAZY (*)    Specific Gravity, Urine 1.032 (*)    Ketones, ur 80 (*)    Protein, ur 30 (*)    All other components within normal limits  LIPASE, BLOOD   CBC   ____________________________________________  ____________________________________________  RADIOLOGY  ED MD interpretation: No evidence of SBO, pancreatitis, stone, or other acute intra-abdominal pathology.  Official radiology report(s): CT ABDOMEN PELVIS W CONTRAST  Result Date: 08/29/2019 CLINICAL DATA:  Acute generalized abdominal pain. EXAM: CT ABDOMEN AND PELVIS WITH CONTRAST TECHNIQUE: Multidetector CT imaging of the abdomen and pelvis was performed using the standard protocol following bolus administration of intravenous contrast. CONTRAST:  OMNIPAQUE IOHEXOL 300 MG/ML  SOLN COMPARISON:  March 23, 2016. FINDINGS: Lower chest: No acute abnormality. Hepatobiliary: No focal liver abnormality is seen. No gallstones, gallbladder wall thickening, or biliary dilatation. Pancreas: Unremarkable. No pancreatic ductal dilatation or surrounding inflammatory changes. Spleen: Normal in size without focal abnormality. Adrenals/Urinary Tract: Adrenal glands are unremarkable. Kidneys are normal, without renal calculi, focal lesion, or hydronephrosis. Bladder is unremarkable. Stomach/Bowel: Stomach is within normal limits. Appendix appears normal. No evidence of bowel wall thickening, distention, or inflammatory changes. Vascular/Lymphatic: No significant vascular findings are present. No enlarged abdominal or pelvic lymph nodes. Reproductive: Prostate is unremarkable. Other: No abdominal wall hernia or abnormality. No abdominopelvic ascites. Musculoskeletal: No acute or significant osseous findings. IMPRESSION: No abnormality seen in the abdomen or pelvis. Electronically Signed   By: Lupita Raider M.D.   On: 08/29/2019 13:19    ____________________________________________   PROCEDURES  Procedure(s) performed (including Critical Care):  Procedures   ____________________________________________   INITIAL IMPRESSION / ASSESSMENT AND PLAN / ED COURSE        Overall patient's  history, exam, any work-up most concerning for gastritis possibly infectious versus other..  Exam, labs, and CT show no evidence of acute pancreatitis, appendicitis, diverticulitis, kidney stone, perforated viscus have a low suspicion for other acute life-threatening process.  No evidence of cholestatic process on CMP and UA is suggestive of mild dehydration with ketones in the urine.  Patient was given Zofran IV fluids and discharged in stable condition.  Strict return precautions advised and discussed.      ____________________________________________   FINAL CLINICAL IMPRESSION(S) / ED DIAGNOSES  Final diagnoses:  Generalized abdominal pain     ED Discharge Orders         Ordered    ondansetron (ZOFRAN) 4 MG tablet  Every 8 hours PRN     Discontinue  Reprint     08/29/19 1329           Note:  This document was prepared using Dragon voice recognition software and may include unintentional dictation errors.   Gilles Chiquito, MD 08/29/19 1330

## 2020-02-25 DIAGNOSIS — J45909 Unspecified asthma, uncomplicated: Secondary | ICD-10-CM | POA: Insufficient documentation

## 2020-02-25 DIAGNOSIS — F909 Attention-deficit hyperactivity disorder, unspecified type: Secondary | ICD-10-CM | POA: Insufficient documentation

## 2020-08-15 ENCOUNTER — Other Ambulatory Visit: Payer: Self-pay

## 2020-08-15 ENCOUNTER — Encounter: Payer: Self-pay | Admitting: Emergency Medicine

## 2020-08-15 ENCOUNTER — Emergency Department
Admission: EM | Admit: 2020-08-15 | Discharge: 2020-08-15 | Disposition: A | Discharge status: Home / Self Care | Payer: Self-pay | Attending: Emergency Medicine | Admitting: Emergency Medicine

## 2020-08-15 ENCOUNTER — Emergency Department: Payer: Self-pay

## 2020-08-15 DIAGNOSIS — M79605 Pain in left leg: Secondary | ICD-10-CM

## 2020-08-15 DIAGNOSIS — M79661 Pain in right lower leg: Secondary | ICD-10-CM | POA: Insufficient documentation

## 2020-08-15 DIAGNOSIS — F17290 Nicotine dependence, other tobacco product, uncomplicated: Secondary | ICD-10-CM | POA: Insufficient documentation

## 2020-08-15 DIAGNOSIS — J45909 Unspecified asthma, uncomplicated: Secondary | ICD-10-CM | POA: Insufficient documentation

## 2020-08-15 DIAGNOSIS — M79604 Pain in right leg: Secondary | ICD-10-CM

## 2020-08-15 DIAGNOSIS — M79662 Pain in left lower leg: Secondary | ICD-10-CM | POA: Insufficient documentation

## 2020-08-15 LAB — CBC WITH DIFFERENTIAL/PLATELET
Abs Immature Granulocytes: 0.03 10*3/uL (ref 0.00–0.07)
Basophils Absolute: 0.1 10*3/uL (ref 0.0–0.1)
Basophils Relative: 1 %
Eosinophils Absolute: 0.5 10*3/uL (ref 0.0–0.5)
Eosinophils Relative: 7 %
HCT: 39.8 % (ref 39.0–52.0)
Hemoglobin: 14.2 g/dL (ref 13.0–17.0)
Immature Granulocytes: 0 %
Lymphocytes Relative: 36 %
Lymphs Abs: 2.5 10*3/uL (ref 0.7–4.0)
MCH: 29.7 pg (ref 26.0–34.0)
MCHC: 35.7 g/dL (ref 30.0–36.0)
MCV: 83.3 fL (ref 80.0–100.0)
Monocytes Absolute: 0.4 10*3/uL (ref 0.1–1.0)
Monocytes Relative: 6 %
Neutro Abs: 3.4 10*3/uL (ref 1.7–7.7)
Neutrophils Relative %: 50 %
Platelets: 239 10*3/uL (ref 150–400)
RBC: 4.78 MIL/uL (ref 4.22–5.81)
RDW: 12.8 % (ref 11.5–15.5)
WBC: 7 10*3/uL (ref 4.0–10.5)
nRBC: 0 % (ref 0.0–0.2)

## 2020-08-15 LAB — COMPREHENSIVE METABOLIC PANEL
ALT: 23 U/L (ref 0–44)
AST: 21 U/L (ref 15–41)
Albumin: 4.6 g/dL (ref 3.5–5.0)
Alkaline Phosphatase: 68 U/L (ref 38–126)
Anion gap: 7 (ref 5–15)
BUN: 22 mg/dL — ABNORMAL HIGH (ref 6–20)
CO2: 27 mmol/L (ref 22–32)
Calcium: 9.5 mg/dL (ref 8.9–10.3)
Chloride: 103 mmol/L (ref 98–111)
Creatinine, Ser: 1.07 mg/dL (ref 0.61–1.24)
GFR, Estimated: >60 mL/min (ref >60)
Glucose, Bld: 120 mg/dL — ABNORMAL HIGH (ref 70–99)
Potassium: 3.9 mmol/L (ref 3.5–5.1)
Sodium: 137 mmol/L (ref 135–145)
Total Bilirubin: 1.3 mg/dL — ABNORMAL HIGH (ref 0.3–1.2)
Total Protein: 7.5 g/dL (ref 6.5–8.1)

## 2020-08-15 LAB — CK: Total CK: 153 U/L (ref 49–397)

## 2020-08-15 MED ORDER — METHOCARBAMOL 500 MG PO TABS: 500.0000 mg | ORAL_TABLET | Freq: Three times a day (TID) | ORAL | 0 refills | Status: AC | PRN

## 2020-08-15 MED ORDER — MELOXICAM 15 MG PO TABS
15.0000 mg | ORAL_TABLET | Freq: Every day | ORAL | 2 refills | Status: DC
Start: 2020-08-15 — End: 2020-12-04

## 2020-08-15 NOTE — ED Notes (Signed)
Pt c/o bilateral calf pain and stiffness for the last ten days despite using icy hot, ice, OTC medication. Pt states he works on his feet in [employer] but is otherwise not exercising heavily. Calf do not appear swollen or red, CMS intact, skin is warm and dry.

## 2020-08-15 NOTE — ED Notes (Signed)
Pt given snack. 

## 2020-08-15 NOTE — ED Triage Notes (Signed)
Pt to ED via POV with c/o bilateral calf pain. Pt states pain worse with walking, pt states lots of standing for work at [employer]. Pt states tightness with standing and walking.

## 2020-08-15 NOTE — ED Provider Notes (Signed)
[facility]-EMERGENCY DEPARTMENT  ____________________________________________  Time seen: Approximately 8:40 PM  I have reviewed the triage vital signs and the nursing notes.   HISTORY  Chief Complaint Leg Pain   Historian Patient     HPI Joseph Keller is a 25 y.o. male presents to the emergency department with bilateral calf pain.  Patient reports that he has been experiencing calf pain for the past several weeks.  Patient describes it as a cramping sensation that is constant in nature.  He states that he has been doing more workouts than usual.  He denies falls or mechanisms of trauma.  No recent surgery, prolonged immobilization, history of DVT or daily smoking.  Denies history of similar symptoms in the past.   Past Medical History:  Diagnosis Date   ADHD (attention deficit hyperactivity disorder)    Asthma      Immunizations up to date:  Yes.     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 Authorizing Provider  meloxicam (MOBIC) 15 MG tablet Take 1 tablet (15 mg total) by mouth daily. 08/15/20 08/15/21 Yes Pia Mau M, PA-C  methocarbamol (ROBAXIN) 500 MG tablet Take 1 tablet (500 mg total) by mouth every 8 (eight) hours as needed for up to 5 days. 08/15/20 08/20/20 Yes Pia Mau M, PA-C  acetaminophen-codeine (TYLENOL #3) 300-30 MG tablet Take 1-2 tablets by mouth every 4 (four) hours as needed for moderate pain. 07/23/19   Triplett, Rulon Eisenmenger B, FNP  albuterol (PROVENTIL HFA;VENTOLIN HFA) 108 (90 Base) MCG/ACT inhaler Inhale 2 puffs into the  lungs every 6 (six) hours as needed for wheezing or shortness of breath. 02/14/17   Willy Eddy, MD  divalproex (DEPAKOTE) 250 MG DR tablet Take 250 mg by mouth 2 (two) times daily. 04/27/16   [provider]  escitalopram (LEXAPRO) 10 MG tablet Take 1 tablet (10 mg total) by mouth at bedtime. 03/05/15   Thedora Hinders, MD  ibuprofen (ADVIL,MOTRIN) 600 MG tablet Take 1 tablet by mouth three times daily with meals 05/15/16   Loleta Rose, MD  naproxen (NAPROSYN) 500 MG tablet Take 1 tablet (500 mg total) by mouth 2 (two) times daily with a meal. 01/25/17   Jene Every, MD  ondansetron (ZOFRAN) 4 MG tablet Take 1 tablet (4 mg total) by mouth every 8 (eight) hours as needed for up to 10 doses for nausea or vomiting. 08/29/19   Gilles Chiquito, MD    Allergies Patient has no known allergies.  History reviewed. No pertinent family history.  Social History Social History   Tobacco Use   Smoking status: Every Day    Types: E-cigarettes   Smokeless tobacco: Never   Tobacco comments:    Pt reports e-cigarette use "around 5 times a day"  Substance Use Topics   Alcohol use: No   Drug use: No     Review of Systems  Constitutional: No fever/chills Eyes:  No discharge ENT: No upper respiratory complaints. Respiratory: no cough. No SOB/ use of accessory muscles to breath Gastrointestinal:   No  nausea, no vomiting.  No diarrhea.  No constipation. Musculoskeletal: Patient has bilateral calf pain.  Skin: Negative for rash, abrasions, lacerations, ecchymosis.    ____________________________________________   PHYSICAL EXAM:  VITAL SIGNS: ED Triage Vitals [08/15/20 1926]  Enc Vitals Group     BP (!) 146/94     Pulse Rate 74     Resp 20     Temp 98.3 F (36.8 C)     Temp Source Oral     SpO2 100 %     Weight 140 lb (63.5 kg)     Height 5\' 2"  (1.575 m)     Head Circumference      Peak Flow      Pain Score 8     Pain Loc      Pain Edu?      Excl. in GC?       Constitutional: Alert and oriented. Well appearing and in no acute distress. Eyes: Conjunctivae are normal. PERRL. EOMI. Head: Atraumatic. ENT: Cardiovascular: Normal rate, regular rhythm. Normal S1 and S2.  Good peripheral circulation. Respiratory: Normal respiratory effort without tachypnea or retractions. Lungs CTAB. Good air entry to the bases with no decreased or absent breath sounds Gastrointestinal: Bowel sounds x 4 quadrants. Soft and nontender to palpation. No guarding or rigidity. No distention. Musculoskeletal: Full range of motion to all extremities. No obvious deformities noted Neurologic:  Normal for age. No gross focal neurologic deficits are appreciated.  Skin:  Skin is warm, dry and intact. No rash noted. Psychiatric: Mood and affect are normal for age. Speech and behavior are normal.   ____________________________________________   LABS (all labs ordered are listed, but only abnormal results are displayed)  Labs Reviewed  COMPREHENSIVE METABOLIC PANEL - Abnormal; Notable for the following components:      Result Value   Glucose, Bld 120 (*)    BUN 22 (*)    Total Bilirubin 1.3 (*)    All other components within normal limits  CBC WITH DIFFERENTIAL/PLATELET  CK   ____________________________________________  EKG   ____________________________________________  RADIOLOGY   Venous Img Lower Bilateral  Result Date: 08/15/2020 CLINICAL DATA:  Lower extremity pain for 10 days EXAM: BILATERAL LOWER EXTREMITY VENOUS DOPPLER ULTRASOUND TECHNIQUE: Gray-scale sonography with compression, as well as color and duplex ultrasound, were performed to evaluate the deep venous system(s) from the level of the common femoral vein through the popliteal and proximal calf veins. COMPARISON:  None. FINDINGS: VENOUS Normal compressibility of the common femoral, superficial femoral, and popliteal veins, as well as the visualized calf veins. Visualized portions of profunda  femoral vein and great saphenous vein unremarkable. No filling defects to suggest DVT on grayscale or color Doppler imaging. Doppler waveforms show normal direction of venous flow, normal respiratory plasticity and response to augmentation. OTHER None. Limitations: none IMPRESSION: 1. No evidence of deep venous thrombosis within either lower extremity. Electronically Signed   By: 08/17/2020 M.D.   On: 08/15/2020 21:19    ____________________________________________    PROCEDURES  Procedure(s) performed:     Procedures     Medications - No data to display   ____________________________________________   INITIAL IMPRESSION / ASSESSMENT AND PLAN / ED COURSE  Pertinent labs & imaging results that were available during my care of the patient were reviewed by me and considered in my medical decision making (see chart for details).      Assessment and Plan:  Calf pain:  25 year old male presents to the emergency department with  bilateral calf pain.  CBC, CMP and CK were within reference range.  No evidence of DVT bilaterally.  Patient was discharged with meloxicam and Robaxin.  Return precautions were given to return with new or worsening symptoms.    ____________________________________________  FINAL CLINICAL IMPRESSION(S) / ED DIAGNOSES  Final diagnoses:  Pain in both lower extremities      NEW MEDICATIONS STARTED DURING THIS VISIT:  ED Discharge Orders          Ordered    meloxicam (MOBIC) 15 MG tablet  Daily        08/15/20 2211    methocarbamol (ROBAXIN) 500 MG tablet  Every 8 hours PRN        08/15/20 2211                This chart was dictated using voice recognition software/Dragon. Despite best efforts to proofread, errors can occur which can change the meaning. Any change was purely unintentional.     Gasper Lloyd 08/15/20 2307    Minna Antis, MD 08/23/20 1359

## 2020-12-04 ENCOUNTER — Other Ambulatory Visit: Payer: Self-pay

## 2020-12-04 ENCOUNTER — Emergency Department
Admission: EM | Admit: 2020-12-04 | Discharge: 2020-12-04 | Disposition: A | Discharge status: Home / Self Care | Payer: No Typology Code available for payment source | Attending: Student in an Organized Health Care Education/Training Program | Admitting: Student in an Organized Health Care Education/Training Program

## 2020-12-04 ENCOUNTER — Emergency Department: Payer: No Typology Code available for payment source

## 2020-12-04 DIAGNOSIS — F17290 Nicotine dependence, other tobacco product, uncomplicated: Secondary | ICD-10-CM | POA: Diagnosis not present

## 2020-12-04 DIAGNOSIS — J45909 Unspecified asthma, uncomplicated: Secondary | ICD-10-CM | POA: Insufficient documentation

## 2020-12-04 DIAGNOSIS — Z79899 Other long term (current) drug therapy: Secondary | ICD-10-CM | POA: Insufficient documentation

## 2020-12-04 DIAGNOSIS — S20219A Contusion of unspecified front wall of thorax, initial encounter: Secondary | ICD-10-CM | POA: Insufficient documentation

## 2020-12-04 DIAGNOSIS — S299XXA Unspecified injury of thorax, initial encounter: Secondary | ICD-10-CM | POA: Diagnosis present

## 2020-12-04 DIAGNOSIS — Y92410 Unspecified street and highway as the place of occurrence of the external cause: Secondary | ICD-10-CM | POA: Diagnosis not present

## 2020-12-04 NOTE — Discharge Instructions (Addendum)
Follow-up with your primary care provider or [facility] acute care if any continued problems or concerns.  You may use ice to your chest as needed for discomfort.  Tylenol or ibuprofen for pain or discomfort.  If any worsening or urgent concerns over the weekend return to the emergency department.  You may also follow-up with [facility] acute care.
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
## 2020-12-04 NOTE — ED Notes (Signed)
Patient transported to X-ray 
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
## 2021-02-16 ENCOUNTER — Ambulatory Visit: Payer: Self-pay | Admitting: *Deleted

## 2021-02-16 NOTE — Telephone Encounter (Signed)
°  Chief Complaint: R shoulder pain and swelling Symptoms: pain and swelling Frequency: 1 week Pertinent Negatives: Patient denies rash, fever, numbness Disposition: [] ED /[x] Urgent Care (no appt availability in office) / [] Appointment(In office/virtual)/ []  [place] [facility]/ [] Home Care/ [] Refused Recommended Disposition /[] [facility]/ []  Follow-up with PCP Additional Notes:

## 2021-02-16 NOTE — Telephone Encounter (Signed)
Summary: should muscle and scapular pain with swelling   Pt called the community line with a complaint of shoulder and neck pain / pt stated it has been a week / pt stated one size of his shoulder muscles are larger than the other side / please advise      Reason for Disposition  [1] Unable to use arm at all AND [2] because of shoulder pain or stiffness  Answer Assessment - Initial Assessment Questions 1. ONSET: "When did the pain start?"     1 week 2. LOCATION: "Where is the pain located?"     R shoulder  3. PAIN: "How bad is the pain?" (Scale 1-10; or mild, moderate, severe)   - MILD (1-3): doesn't interfere with normal activities   - MODERATE (4-7): interferes with normal activities (e.g., work or school) or awakens from sleep   - SEVERE (8-10): excruciating pain, unable to do any normal activities, unable to move arm at all due to pain     moderate 4. WORK OR EXERCISE: "Has there been any recent work or exercise that involved this part of the body?"     Patient does work in [employer], doing more exercise 5. CAUSE: "What do you think is causing the shoulder pain?"     Possible injury 6. OTHER SYMPTOMS: "Do you have any other symptoms?" (e.g., neck pain, swelling, rash, fever, numbness, weakness)     swelling 7. PREGNANCY: "Is there any chance you are pregnant?" "When was your last menstrual period?"     *No Answer*  Protocols used: Shoulder Pain-A-AH

## 2021-02-17 DIAGNOSIS — M778 Other enthesopathies, not elsewhere classified: Secondary | ICD-10-CM | POA: Insufficient documentation

## 2021-02-17 DIAGNOSIS — M47812 Spondylosis without myelopathy or radiculopathy, cervical region: Secondary | ICD-10-CM | POA: Insufficient documentation

## 2021-02-17 NOTE — Telephone Encounter (Signed)
called back needing further advice from a nurse, pt spoke with NT yesterday 1/17.   ----- Message from Areatha Keas sent at 02/16/2021  2:13 PM EST -----  Pt called the community line with a complaint of shoulder and neck pain / pt stated it has been a week / pt stated one size of his shoulder muscles are larger than the other side / please advise

## 2021-02-17 NOTE — Telephone Encounter (Signed)
Pt. Reports right shoulder is worse today and right upper arm is painful. Did not go UC. Pt. Given [facility] information and [facility]. Number for resources. States he cannot afford to pay for UC today. Verbalizes understanding.

## 2021-06-10 DIAGNOSIS — H53149 Visual discomfort, unspecified: Secondary | ICD-10-CM | POA: Insufficient documentation

## 2021-06-12 ENCOUNTER — Other Ambulatory Visit: Payer: Self-pay

## 2021-06-12 ENCOUNTER — Emergency Department
Admission: EM | Admit: 2021-06-12 | Discharge: 2021-06-12 | Disposition: A | Discharge status: Home / Self Care | Payer: Self-pay | Attending: Emergency Medicine | Admitting: Emergency Medicine

## 2021-06-12 ENCOUNTER — Encounter: Payer: Self-pay | Admitting: Emergency Medicine

## 2021-06-12 DIAGNOSIS — L259 Unspecified contact dermatitis, unspecified cause: Secondary | ICD-10-CM | POA: Insufficient documentation

## 2021-06-12 MED ORDER — FAMOTIDINE 20 MG PO TABS
20.0000 mg | ORAL_TABLET | Freq: Once | ORAL | Status: AC
  Administered 2021-06-12: 20 mg via ORAL
  Filled 2021-06-12: qty 1

## 2021-06-12 MED ORDER — DIPHENHYDRAMINE HCL 25 MG PO CAPS
50.0000 mg | ORAL_CAPSULE | Freq: Once | ORAL | Status: AC
  Administered 2021-06-12: 50 mg via ORAL
  Filled 2021-06-12: qty 2

## 2021-06-12 MED ORDER — PREDNISONE 20 MG PO TABS
60.0000 mg | ORAL_TABLET | Freq: Once | ORAL | Status: AC
  Administered 2021-06-12: 60 mg via ORAL
  Filled 2021-06-12: qty 3

## 2021-06-12 MED ORDER — TRIAMCINOLONE ACETONIDE 0.1 % EX CREA: 1.0000 "application " | TOPICAL_CREAM | Freq: Four times a day (QID) | CUTANEOUS | 0 refills | Status: DC

## 2021-06-12 MED ORDER — CETIRIZINE HCL 10 MG PO TABS: 40.0000 mg | ORAL_TABLET | Freq: Every day | ORAL | 0 refills | Status: DC

## 2021-06-12 MED ORDER — PREDNISONE 50 MG PO TABS: 50.0000 mg | ORAL_TABLET | Freq: Every day | ORAL | 0 refills | Status: DC

## 2021-06-12 NOTE — ED Provider Notes (Signed)
? ?[facility] ?Provider Note ? ?Patient Contact: 6:22 PM (approximate) ? ? ?History  ? ?Rash ? ? ?HPI ? ?Joseph Keller is a 26 y.o. male who presents the emergency department with a rash to bilateral forearms.  Patient states that he may have used a new body wash today but does not remember any other new foods, chills or topicals.  He states that about the same brand but it was a brand-new bottle.  Patient has no history of previous reactions.  No medication prior to arrival.  Patient states that he started having a rash and itching to both forearms.  No other symptoms involved with shortness of breath, swelling of the mouth, tongue or throat.  No GI symptoms. ?  ? ? ?Physical Exam  ? ?Triage Vital Signs: ?ED Triage Vitals  ?Enc Vitals Group  ?   BP 06/12/21 1748 133/85  ?   Pulse Rate 06/12/21 1748 83  ?   Resp 06/12/21 1748 16  ?   Temp 06/12/21 1748 98.9 ?F (37.2 ?C)  ?   Temp Source 06/12/21 1748 Oral  ?   SpO2 06/12/21 1748 100 %  ?   Weight 06/12/21 1736 141 lb 1.5 oz (64 kg)  ?   Height 06/12/21 1736 5\' 2"  (1.575 m)  ?   Head Circumference --   ?   Peak Flow --   ?   Pain Score 06/12/21 1736 0  ?   Pain Loc --   ?   Pain Edu? --   ?   Excl. in [place]? --   ? ? ?Most recent vital signs: ?Vitals:  ? 06/12/21 1748  ?BP: 133/85  ?Pulse: 83  ?Resp: 16  ?Temp: 98.9 ?F (37.2 ?C)  ?SpO2: 100%  ? ? ? ?General: Alert and in no acute distress. ?ENT: ?     Ears:  ?     Nose: No congestion/rhinnorhea. ?     Mouth/Throat: Mucous membranes are moist.  No gross oropharyngeal erythema or edema.  No angioedema. ?Neck: No stridor.   ?Cardiovascular:  Good peripheral perfusion ?Respiratory: Normal respiratory effort without tachypnea or retractions. Lungs CTAB ?Musculoskeletal: Full range of motion to all extremities.  ?Neurologic:  No gross focal neurologic deficits are appreciated.  ?Skin:   No rash noted hives and wheals noted to both forearms.  No other evidence of rash ?Other: ? ? ?ED Results /  Procedures / Treatments  ? ?Labs ?(all labs ordered are listed, but only abnormal results are displayed) ?Labs Reviewed - No data to display ? ? ?EKG ? ? ? ? ?RADIOLOGY ? ? ? ?No results found. ? ?PROCEDURES: ? ?Critical Care performed: No ? ?Procedures ? ? ?MEDICATIONS ORDERED IN ED: ?Medications  ?predniSONE (DELTASONE) tablet 60 mg (has no administration in time range)  ?diphenhydrAMINE (BENADRYL) capsule 50 mg (has no administration in time range)  ?famotidine (PEPCID) tablet 20 mg (has no administration in time range)  ? ? ? ?IMPRESSION / MDM / ASSESSMENT AND PLAN / ED COURSE  ?I reviewed the triage vital signs and the nursing notes. ?             ?               ? ?Differential diagnosis includes, but is not limited to, allergic reaction, contact dermatitis, cellulitis ? ? ?Patient's diagnosis is consistent with likely contact dermatitis.  Patient presented to the emergency department with bilateral forearm irritation.  He states that he used  the same brand body wash but had a new bottle prior to the onset of symptoms.  He has no secondary system involvement to be concern for anaphylaxis.  He will be given Benadryl, prednisone, famotidine here in the emergency department. Patient will be discharged home with prescriptions for Zyrtec, triamcinolone and short course of steroids.. Patient is to follow up with primary care as needed or otherwise directed. Patient is given ED precautions to return to the ED for any worsening or new symptoms. ? ? ? ?  ? ? ?FINAL CLINICAL IMPRESSION(S) / ED DIAGNOSES  ? ?Final diagnoses:  ?Contact dermatitis, unspecified contact dermatitis type, unspecified trigger  ? ? ? ?Rx / DC Orders  ? ?ED Discharge Orders   ? ?      Ordered  ?  predniSONE (DELTASONE) 50 MG tablet  Daily with breakfast       ? 06/12/21 1839  ?  cetirizine (ZYRTEC) 10 MG tablet  Daily       ? 06/12/21 1839  ?  triamcinolone cream (KENALOG) 0.1 %  4 times daily       ? 06/12/21 1839  ? ?  ?  ? ?  ? ? ? ?Note:   This document was prepared using Dragon voice recognition software and may include unintentional dictation errors. ?  ?Racheal Patches, PA-C ?06/12/21 1839 ? ?  ?Georga Hacking, MD ?06/12/21 1847 ? ?

## 2021-06-12 NOTE — ED Notes (Signed)
Patient declined discharge vital signs. 

## 2021-06-12 NOTE — ED Triage Notes (Signed)
Pt reports developed a rash on his arms today that itches. Pt reports not sure what he is allergic too and denies any new soaps, clothes, foods, lotions, makeup, etc.  ?

## 2021-07-17 ENCOUNTER — Emergency Department: Payer: Self-pay

## 2021-07-17 ENCOUNTER — Other Ambulatory Visit: Payer: Self-pay

## 2021-07-17 DIAGNOSIS — X501XXA Overexertion from prolonged static or awkward postures, initial encounter: Secondary | ICD-10-CM | POA: Insufficient documentation

## 2021-07-17 DIAGNOSIS — Z5321 Procedure and treatment not carried out due to patient leaving prior to being seen by health care provider: Secondary | ICD-10-CM | POA: Insufficient documentation

## 2021-07-17 DIAGNOSIS — S8991XA Unspecified injury of right lower leg, initial encounter: Secondary | ICD-10-CM | POA: Diagnosis present

## 2021-07-17 NOTE — ED Triage Notes (Signed)
Pt states twisted right leg today at 1000. Pt states he has pain from knee down to ankle. Cms intact, no obvious deformity noted.

## 2021-07-18 ENCOUNTER — Emergency Department
Admission: EM | Admit: 2021-07-18 | Discharge: 2021-07-18 | Discharge status: Against Medical Advice | Payer: Self-pay | Attending: Emergency Medicine | Admitting: Emergency Medicine

## 2021-07-18 NOTE — ED Provider Notes (Signed)
I did not see this patient.  He LWBS after triage.   Laden Fieldhouse, Layla Maw, DO 07/18/21 [phone]

## 2021-07-25 ENCOUNTER — Emergency Department
Admission: EM | Admit: 2021-07-25 | Discharge: 2021-07-25 | Disposition: A | Discharge status: Home / Self Care | Payer: Self-pay | Attending: Student in an Organized Health Care Education/Training Program | Admitting: Student in an Organized Health Care Education/Training Program

## 2021-07-25 ENCOUNTER — Other Ambulatory Visit: Payer: Self-pay

## 2021-07-25 DIAGNOSIS — R112 Nausea with vomiting, unspecified: Secondary | ICD-10-CM | POA: Insufficient documentation

## 2021-07-25 DIAGNOSIS — R432 Parageusia: Secondary | ICD-10-CM | POA: Insufficient documentation

## 2021-07-25 DIAGNOSIS — R197 Diarrhea, unspecified: Secondary | ICD-10-CM | POA: Insufficient documentation

## 2021-07-25 DIAGNOSIS — Z20822 Contact with and (suspected) exposure to covid-19: Secondary | ICD-10-CM | POA: Insufficient documentation

## 2021-07-25 LAB — URINALYSIS, ROUTINE W REFLEX MICROSCOPIC
Bilirubin Urine: NEGATIVE
Glucose, UA: NEGATIVE mg/dL
Hgb urine dipstick: NEGATIVE
Ketones, ur: NEGATIVE mg/dL
Leukocytes,Ua: NEGATIVE
Nitrite: NEGATIVE
Protein, ur: NEGATIVE mg/dL
Specific Gravity, Urine: 1.03 (ref 1.005–1.030)
pH: 6 (ref 5.0–8.0)

## 2021-07-25 LAB — COMPREHENSIVE METABOLIC PANEL
ALT: 25 U/L (ref 0–44)
AST: 21 U/L (ref 15–41)
Albumin: 4.4 g/dL (ref 3.5–5.0)
Alkaline Phosphatase: 82 U/L (ref 38–126)
Anion gap: 3 — ABNORMAL LOW (ref 5–15)
BUN: 11 mg/dL (ref 6–20)
CO2: 28 mmol/L (ref 22–32)
Calcium: 9.7 mg/dL (ref 8.9–10.3)
Chloride: 112 mmol/L — ABNORMAL HIGH (ref 98–111)
Creatinine, Ser: 1.05 mg/dL (ref 0.61–1.24)
GFR, Estimated: >60 mL/min (ref >60)
Glucose, Bld: 103 mg/dL — ABNORMAL HIGH (ref 70–99)
Potassium: 4.3 mmol/L (ref 3.5–5.1)
Sodium: 143 mmol/L (ref 135–145)
Total Bilirubin: 1.1 mg/dL (ref 0.3–1.2)
Total Protein: 7.5 g/dL (ref 6.5–8.1)

## 2021-07-25 LAB — CBC
HCT: 41 % (ref 39.0–52.0)
Hemoglobin: 14.2 g/dL (ref 13.0–17.0)
MCH: 28.1 pg (ref 26.0–34.0)
MCHC: 34.6 g/dL (ref 30.0–36.0)
MCV: 81.2 fL (ref 80.0–100.0)
Platelets: 252 10*3/uL (ref 150–400)
RBC: 5.05 MIL/uL (ref 4.22–5.81)
RDW: 12.4 % (ref 11.5–15.5)
WBC: 5 10*3/uL (ref 4.0–10.5)
nRBC: 0 % (ref 0.0–0.2)

## 2021-07-25 LAB — LIPASE, BLOOD: Lipase: 27 U/L (ref 11–51)

## 2021-07-25 LAB — SARS CORONAVIRUS 2 BY RT PCR: SARS Coronavirus 2 by RT PCR: NEGATIVE

## 2021-07-25 MED ORDER — ONDANSETRON 4 MG PO TBDP: 4.0000 mg | ORAL_TABLET | Freq: Three times a day (TID) | ORAL | 0 refills | Status: DC | PRN

## 2021-07-25 NOTE — ED Provider Notes (Signed)
[facility] Provider Note    Event Date/Time   First MD Initiated Contact with Patient 07/25/21 1224     (approximate)   History   Emesis   HPI  Joseph Keller is a 26 y.o. male who vapes presents to the ER for evaluation of nausea vomiting watery diarrhea for the past 2 to 3 days.  Think he might of gotten some food poisoning is also noted decreased taste over the past 12 to 24 hours.  Denies any new vape pens.  No chest pain or shortness of breath.  He is tolerating p.o.  Works in [employer] and is worried that he might have COVID or some other viral illness.  Denies any abdominal pain.     Physical Exam   Triage Vital Signs: ED Triage Vitals  Enc Vitals Group     BP 07/25/21 1207 114/83     Pulse Rate 07/25/21 1207 66     Resp 07/25/21 1207 18     Temp 07/25/21 1210 97.8 F (36.6 C)     Temp Source 07/25/21 1210 Oral     SpO2 07/25/21 1207 98 %     Weight 07/25/21 1205 145 lb (65.8 kg)     Height 07/25/21 1205 5' (1.524 m)     Head Circumference --      Peak Flow --      Pain Score 07/25/21 1205 4     Pain Loc --      Pain Edu? --      Excl. in [place]? --     Most recent vital signs: Vitals:   07/25/21 1207 07/25/21 1210  BP: 114/83   Pulse: 66   Resp: 18   Temp:  97.8 F (36.6 C)  SpO2: 98%      Constitutional: Alert  Eyes: Conjunctivae are normal.  Head: Atraumatic. Nose: No congestion/rhinnorhea. Mouth/Throat: Mucous membranes are moist.   Neck: Painless ROM.  Cardiovascular:   Good peripheral circulation. Respiratory: Normal respiratory effort.  No retractions.  Gastrointestinal: Soft and nontender.  Musculoskeletal:  no deformity Neurologic:  MAE spontaneously. No gross focal neurologic deficits are appreciated.  Skin:  Skin is warm, dry and intact. No rash noted. Psychiatric: Mood and affect are normal. Speech and behavior are normal.    ED Results / Procedures / Treatments   Labs (all labs ordered are  listed, but only abnormal results are displayed) Labs Reviewed  COMPREHENSIVE METABOLIC PANEL - Abnormal; Notable for the following components:      Result Value   Chloride 112 (*)    Glucose, Bld 103 (*)    Anion gap 3 (*)    All other components within normal limits  URINALYSIS, ROUTINE W REFLEX MICROSCOPIC - Abnormal; Notable for the following components:   Color, Urine YELLOW (*)    APPearance CLEAR (*)    All other components within normal limits  SARS CORONAVIRUS 2 BY RT PCR  LIPASE, BLOOD  CBC     EKG     RADIOLOGY    PROCEDURES:  Critical Care performed:   Procedures   MEDICATIONS ORDERED IN ED: Medications - No data to display   IMPRESSION / MDM / ASSESSMENT AND PLAN / ED COURSE  I reviewed the triage vital signs and the nursing notes.                              Differential diagnosis includes,  but is not limited to, enteritis, gastritis, dehydration, COVID, electrolyte  The patient presented to the ER for evaluation of symptoms as described above.  He is clinically well-appearing with benign exam.  Blood work sent for the above differential is reassuring.  Patient tolerating p.o. therefore do not feel that IV fluids clinically indicated.  Given his benign exam do not feel that imaging clinically indicated.  Does appear stable and appropriate for outpatient follow-up.     FINAL CLINICAL IMPRESSION(S) / ED DIAGNOSES   Final diagnoses:  Nausea vomiting and diarrhea  Loss of taste     Rx / DC Orders   ED Discharge Orders          Ordered    ondansetron (ZOFRAN-ODT) 4 MG disintegrating tablet  Every 8 hours PRN        07/25/21 1335             Note:  This document was prepared using Dragon voice recognition software and may include unintentional dictation errors.    Willy Eddy, MD 07/25/21 [phone]

## 2021-07-31 ENCOUNTER — Emergency Department
Admission: EM | Admit: 2021-07-31 | Discharge: 2021-07-31 | Disposition: A | Discharge status: Home / Self Care | Payer: No Typology Code available for payment source | Attending: Emergency Medicine | Admitting: Emergency Medicine

## 2021-07-31 ENCOUNTER — Emergency Department: Payer: Self-pay

## 2021-07-31 ENCOUNTER — Other Ambulatory Visit: Payer: Self-pay

## 2021-07-31 ENCOUNTER — Encounter: Payer: Self-pay | Admitting: Emergency Medicine

## 2021-07-31 DIAGNOSIS — S3992XA Unspecified injury of lower back, initial encounter: Secondary | ICD-10-CM | POA: Diagnosis present

## 2021-07-31 DIAGNOSIS — Y990 Civilian activity done for income or pay: Secondary | ICD-10-CM | POA: Insufficient documentation

## 2021-07-31 DIAGNOSIS — S20229A Contusion of unspecified back wall of thorax, initial encounter: Secondary | ICD-10-CM

## 2021-07-31 DIAGNOSIS — W010XXA Fall on same level from slipping, tripping and stumbling without subsequent striking against object, initial encounter: Secondary | ICD-10-CM | POA: Insufficient documentation

## 2021-07-31 DIAGNOSIS — W19XXXA Unspecified fall, initial encounter: Secondary | ICD-10-CM

## 2021-07-31 DIAGNOSIS — S39012A Strain of muscle, fascia and tendon of lower back, initial encounter: Secondary | ICD-10-CM | POA: Insufficient documentation

## 2021-07-31 MED ORDER — IBUPROFEN 800 MG PO TABS
800.0000 mg | ORAL_TABLET | Freq: Once | ORAL | Status: AC
Start: 2021-07-31 — End: 2021-07-31
  Administered 2021-07-31: 800 mg via ORAL
  Filled 2021-07-31: qty 1

## 2021-07-31 NOTE — ED Provider Notes (Signed)
[facility] Emergency Department Provider Note     Event Date/Time   First MD Initiated Contact with Patient 07/31/21 2031     (approximate)   History   Fall   HPI  Joseph Keller is a 26 y.o. male presents to the ED for evaluation of injury sustained following mechanical injury at work.  Patient describes a mechanical injury at work approximately 10 minutes prior to arrival.  He presents with low back pain after he apparently stepped in a puddle while carrying a box and slipped and fell.  He denies any head injury or LOC.  Patient denies any bladder or bowel incontinence, foot drop, saddle anesthesia.     Physical Exam   Triage Vital Signs: ED Triage Vitals  Enc Vitals Group     BP 07/31/21 1939 129/78     Pulse Rate 07/31/21 1937 88     Resp 07/31/21 1937 18     Temp 07/31/21 1939 98.3 F (36.8 C)     Temp Source 07/31/21 1937 Oral     SpO2 07/31/21 1939 98 %     Weight 07/31/21 1940 145 lb (65.8 kg)     Height 07/31/21 1940 5' (1.524 m)     Head Circumference --      Peak Flow --      Pain Score 07/31/21 1940 8     Pain Loc --      Pain Edu? --      Excl. in [place]? --     Most recent vital signs: Vitals:   07/31/21 1939 07/31/21 2200  BP: 129/78 125/74  Pulse: 89 75  Resp: 20 17  Temp: 98.3 F (36.8 C)   SpO2: 98%     General Awake, no distress.  CV:  Good peripheral perfusion.  RESP:  Normal effort.  ABD:  No distention.  MSK:  Normal spinal alignment without midline tenderness, spasm, vomiting, or step-off. NEURO: Cranial nerves II to XII grossly intact.  Normal LE DTRs bilaterally.    ED Results / Procedures / Treatments   Labs (all labs ordered are listed, but only abnormal results are displayed) Labs Reviewed - No data to display   EKG   RADIOLOGY  I personally viewed and evaluated these images as part of my medical decision making, as well as reviewing the written report by the radiologist.  ED Provider  Interpretation: No acute findings  DG Lumbar Spine  IMPRESSION: Negative.    No results found.   PROCEDURES:  Critical Care performed: No  Procedures   MEDICATIONS ORDERED IN ED: Medications  ibuprofen (ADVIL) tablet 800 mg (800 mg Oral Given 07/31/21 2118)     IMPRESSION / MDM / ASSESSMENT AND PLAN / ED COURSE  I reviewed the triage vital signs and the nursing notes.                              Differential diagnosis includes, but is not limited to, lumbar strain, lumbar compression fracture, radiculopathy, back contusion  Patient's presentation is most consistent with acute complicated illness / injury requiring diagnostic workup.  Patient's diagnosis is consistent with lumbar strain.  Patient with an overall reassuring exam without red flags, acute motor deficit, or indication of acute injury based on my review of images.  Patient will be discharged home with prescriptions for ibuprofen. Patient is to follow up with his primary provider or company approved provider as  needed or otherwise directed. Patient is given ED precautions to return to the ED for any worsening or new symptoms.     FINAL CLINICAL IMPRESSION(S) / ED DIAGNOSES   Final diagnoses:  Fall, initial encounter  Contusion of back, unspecified laterality, initial encounter  Back strain, initial encounter     Rx / DC Orders   ED Discharge Orders     None        Note:  This document was prepared using Dragon voice recognition software and may include unintentional dictation errors.    Lissa Hoard, PA-C 08/05/21 1943    Minna Antis, MD 08/06/21 1511

## 2021-07-31 NOTE — Discharge Instructions (Addendum)
Take OTC Tylenol and Motrin both 3 times a day for any ongoing back pain.  Apply ice and/or moist heat to the back to reduce pain and spasm.  Follow-up with your primary provider or the urgent care approved by your employer.  Return to the ED if needed.

## 2021-07-31 NOTE — ED Triage Notes (Signed)
Pt to ED via POV, pt states fell at work approx 10 mins PTA. Pt c/o lower back pain at this time. Pt states is filing as WC, states works at [employer] in [place]. Pt states stepped in a puddle while carrying a box and slipped.

## 2021-08-15 ENCOUNTER — Encounter: Payer: Self-pay | Admitting: Emergency Medicine

## 2021-08-15 ENCOUNTER — Emergency Department
Admission: EM | Admit: 2021-08-15 | Discharge: 2021-08-15 | Disposition: A | Discharge status: Home / Self Care | Payer: Self-pay | Attending: Emergency Medicine | Admitting: Emergency Medicine

## 2021-08-15 ENCOUNTER — Other Ambulatory Visit: Payer: Self-pay

## 2021-08-15 DIAGNOSIS — S0502XA Injury of conjunctiva and corneal abrasion without foreign body, left eye, initial encounter: Secondary | ICD-10-CM | POA: Insufficient documentation

## 2021-08-15 DIAGNOSIS — W312XXA Contact with powered woodworking and forming machines, initial encounter: Secondary | ICD-10-CM | POA: Insufficient documentation

## 2021-08-15 MED ORDER — POLYMYXIN B-TRIMETHOPRIM 10000-0.1 UNIT/ML-% OP SOLN: 1.0000 [drp] | OPHTHALMIC | 0 refills | Status: DC

## 2021-08-15 MED ORDER — FLUORESCEIN SODIUM 1 MG OP STRP: 1.0000 | ORAL_STRIP | Freq: Once | OPHTHALMIC | Status: DC

## 2021-08-15 MED ORDER — TETRACAINE HCL 0.5 % OP SOLN: 1.0000 [drp] | Freq: Once | OPHTHALMIC | Status: DC

## 2021-08-15 NOTE — ED Provider Notes (Signed)
[facility] Provider Note   None    (approximate) History  Foreign Body  HPI Joseph Keller is a 26 y.o. male with no stated past medical history presents for left eye pain after woodworking yesterday without wearing eye protection and feeling of foreign body sensation as if a piece of wood got into his eye.  Patient states that he has been having 10/10 left eye pain since that time despite trying to wash his eye out under the sink.  Patient also endorses waking up this morning with worsened eye pain as well as crusting around his eyelashes that he is concerned may be infectious.  Patient denies any fever/chills.  Patient denies any contact lens use ROS: Patient currently denies any vision changes, tinnitus, difficulty speaking, facial droop, sore throat, chest pain, shortness of breath, abdominal pain, nausea/vomiting/diarrhea, dysuria, or weakness/numbness/paresthesias in any extremity   Physical Exam  Triage Vital Signs: ED Triage Vitals  Enc Vitals Group     BP 08/15/21 0722 123/71     Pulse Rate 08/15/21 0722 75     Resp 08/15/21 0722 16     Temp 08/15/21 0722 98.1 F (36.7 C)     Temp Source 08/15/21 0722 Oral     SpO2 08/15/21 0722 94 %     Weight 08/15/21 0724 155 lb (70.3 kg)     Height 08/15/21 0724 5' (1.524 m)     Head Circumference --      Peak Flow --      Pain Score 08/15/21 0724 10     Pain Loc --      Pain Edu? --      Excl. in GC? --    Most recent vital signs: Vitals:   08/15/21 0722  BP: 123/71  Pulse: 75  Resp: 16  Temp: 98.1 F (36.7 C)  SpO2: 94%   General: Awake, oriented x4. CV:  Good peripheral perfusion.  Resp:  Normal effort.  Abd:  No distention.  Other:  Young adult Caucasian male laying in seat in eye room in moderate distress secondary to pain.  Slit-lamp exam under fluorescein stain does show corneal abrasion to left eye at the 3 o'clock position of the cornea just lateral to the iris.  Negative Seidel sign ED  Results / Procedures / Treatments   PROCEDURES: Critical Care performed: No Procedures MEDICATIONS ORDERED IN ED: Medications  tetracaine (PONTOCAINE) 0.5 % ophthalmic solution 1 drop (has no administration in time range)  fluorescein ophthalmic strip 1 strip (has no administration in time range)   IMPRESSION / MDM / ASSESSMENT AND PLAN / ED COURSE  I reviewed the triage vital signs and the nursing notes.                             The patient is on the cardiac monitor to evaluate for evidence of arrhythmia and/or significant heart rate changes. Patient's presentation is most consistent with acute presentation with potential threat to life or bodily function. 26 year old male with history and exam consistent with corneal abrasion of the left eye.  Initial considerations in this patient included corneal abrasion, intraocular and corneal foreign bodies, corneal ulceration, various etiologies of iritis, and various etiologies of conjunctivitis amongst others.   Patient presented with eye pain and redness with associated foreign body sensation and tearing in the setting of recent wood foreign body in the eye suggestive of corneal abrasion.  Patient noted to  have corneal abrasion on fluorescein examination with wood's lamp of the eye.  No evidence of foreign bodies with eversion of the eyelid.  Patient denies contact lens use, and has no other findings suggestive of corneal ulceration at this time.  No evidence of a positive Seidel test or other findings suggestive of globe perforation on evaluation in the ED.  No evidence of corneal foreign body on exam.   Significant improvement in pain and visual acuity noted with application of topical anesthetic to the eye.  Prior to discharge, we discussed return precautions, treatment with lubricating eye drops and NSAIDs, and follow up with primary care doctor within 1 week as needed for further evaluation, and the patient demonstrated understanding and  agreement.   FINAL CLINICAL IMPRESSION(S) / ED DIAGNOSES   Final diagnoses:  Corneal abrasion, left, initial encounter   Rx / DC Orders   ED Discharge Orders          Ordered    trimethoprim-polymyxin b (POLYTRIM) ophthalmic solution  Every 4 hours        08/15/21 0841           Note:  This document was prepared using Dragon voice recognition software and may include unintentional dictation errors.   Merwyn Katos, MD 08/15/21 1026

## 2021-08-15 NOTE — ED Triage Notes (Signed)
Patient was wood working and got a piece of wood in eye yesterday as well as wood dust.  C/o pain and burning to left eye as well as blurry vision and difficulty seeing.  No fever.  Redness noted to sclera.

## 2021-08-15 NOTE — ED Notes (Signed)
Pt was seen and discharged by dr bradler.

## 2021-10-02 ENCOUNTER — Emergency Department
Admission: EM | Admit: 2021-10-02 | Discharge: 2021-10-02 | Disposition: A | Discharge status: Home / Self Care | Payer: Self-pay | Attending: Physician Assistant | Admitting: Physician Assistant

## 2021-10-02 ENCOUNTER — Other Ambulatory Visit: Payer: Self-pay

## 2021-10-02 ENCOUNTER — Encounter: Payer: Self-pay | Admitting: Emergency Medicine

## 2021-10-02 DIAGNOSIS — J45909 Unspecified asthma, uncomplicated: Secondary | ICD-10-CM | POA: Insufficient documentation

## 2021-10-02 DIAGNOSIS — L739 Follicular disorder, unspecified: Secondary | ICD-10-CM | POA: Insufficient documentation

## 2021-10-02 MED ORDER — CLINDAMYCIN PHOSPHATE 1 % EX GEL: Freq: Two times a day (BID) | CUTANEOUS | 0 refills | Status: AC

## 2021-10-02 NOTE — ED Triage Notes (Signed)
Pt reports started with hives to his chest and back last pm. Pt unsure what the reaction is coming from. Pt denies new foods, soaps, sheets, clothes or anything else out of routine.

## 2021-10-02 NOTE — Discharge Instructions (Addendum)
Use the antibiotic gel as directed.  Follow-up with your primary provider for ongoing symptoms.  Return to the ED if necessary.

## 2021-10-02 NOTE — ED Provider Notes (Signed)
[facility] Emergency Department Provider Note     None    (approximate)   History   Allergic Reaction   HPI  Joseph Keller is a 26 y.o. male with a history of asthma, ADHD, and major depressive disorder, presents to the ED reporting describes as hives to his trunk with onset last night.  Patient is unclear of any known exposures, allergens, or irritant contacts.  He denies any cough, congestion, chest pain, or shortness of breath.     Physical Exam   Triage Vital Signs: ED Triage Vitals  Enc Vitals Group     BP 10/02/21 0846 122/88     Pulse Rate 10/02/21 0846 62     Resp 10/02/21 0846 14     Temp 10/02/21 0846 98.3 F (36.8 C)     Temp Source 10/02/21 0846 Oral     SpO2 10/02/21 0846 100 %     Weight 10/02/21 0823 154 lb 5.2 oz (70 kg)     Height 10/02/21 0823 5' (1.524 m)     Head Circumference --      Peak Flow --      Pain Score 10/02/21 0822 8     Pain Loc --      Pain Edu? --      Excl. in GC? --     Most recent vital signs: Vitals:   10/02/21 0846 10/02/21 1203  BP: 122/88 116/79  Pulse: 62 63  Resp: 14 19  Temp: 98.3 F (36.8 C) 98 F (36.7 C)  SpO2: 100% 99%    General Awake, no distress. NAD HEENT NCAT. PERRL. EOMI. No rhinorrhea. Mucous membranes are moist.  CV:  Good peripheral perfusion.  RESP:  Normal effort.  ABD:  No distention.  SKIN:  Multiple pustules noted to the chest and back consistent with folliculitis.    ED Results / Procedures / Treatments   Labs (all labs ordered are listed, but only abnormal results are displayed) Labs Reviewed - No data to display   EKG   RADIOLOGY  No results found.   PROCEDURES:  Critical Care performed: No  Procedures   MEDICATIONS ORDERED IN ED: Medications - No data to display   IMPRESSION / MDM / ASSESSMENT AND PLAN / ED COURSE  I reviewed the triage vital signs and the nursing notes.                              Differential diagnosis  includes, but is not limited to, folliculitis, contact dermatitis, eczema, urticaria  Patient's presentation is most consistent with acute, uncomplicated illness.  Patient's diagnosis is consistent with folliculitis. Patient will be discharged home with prescriptions for clindamycin gel. Patient is to follow up with his {PCP or local urgent care as needed or otherwise directed. Patient is given ED precautions to return to the ED for any worsening or new symptoms.     FINAL CLINICAL IMPRESSION(S) / ED DIAGNOSES   Final diagnoses:  Folliculitis     Rx / DC Orders   ED Discharge Orders          Ordered    clindamycin (CLINDAGEL) 1 % gel  2 times daily        10/02/21 1146             Note:  This document was prepared using Dragon voice recognition software and may include unintentional dictation errors.  Lissa Hoard, PA-C 10/02/21 1717    Pilar Jarvis, MD 10/02/21 2007

## 2021-10-29 ENCOUNTER — Other Ambulatory Visit: Payer: Self-pay

## 2021-10-29 ENCOUNTER — Encounter: Payer: Self-pay | Admitting: Emergency Medicine

## 2021-10-29 DIAGNOSIS — R55 Syncope and collapse: Secondary | ICD-10-CM | POA: Insufficient documentation

## 2021-10-29 DIAGNOSIS — R519 Headache, unspecified: Secondary | ICD-10-CM | POA: Insufficient documentation

## 2021-10-29 DIAGNOSIS — R42 Dizziness and giddiness: Secondary | ICD-10-CM | POA: Insufficient documentation

## 2021-10-29 DIAGNOSIS — J45909 Unspecified asthma, uncomplicated: Secondary | ICD-10-CM | POA: Insufficient documentation

## 2021-10-29 LAB — URINALYSIS, ROUTINE W REFLEX MICROSCOPIC
Bilirubin Urine: NEGATIVE
Glucose, UA: NEGATIVE mg/dL
Hgb urine dipstick: NEGATIVE
Ketones, ur: NEGATIVE mg/dL
Leukocytes,Ua: NEGATIVE
Nitrite: NEGATIVE
Protein, ur: NEGATIVE mg/dL
Specific Gravity, Urine: 1.02 (ref 1.005–1.030)
pH: 5 (ref 5.0–8.0)

## 2021-10-29 LAB — CBC
HCT: 38.2 % — ABNORMAL LOW (ref 39.0–52.0)
Hemoglobin: 13.3 g/dL (ref 13.0–17.0)
MCH: 29.2 pg (ref 26.0–34.0)
MCHC: 34.8 g/dL (ref 30.0–36.0)
MCV: 83.8 fL (ref 80.0–100.0)
Platelets: 213 10*3/uL (ref 150–400)
RBC: 4.56 MIL/uL (ref 4.22–5.81)
RDW: 13.2 % (ref 11.5–15.5)
WBC: 6.9 10*3/uL (ref 4.0–10.5)
nRBC: 0 % (ref 0.0–0.2)

## 2021-10-29 LAB — BASIC METABOLIC PANEL WITH GFR
Anion gap: 11 (ref 5–15)
BUN: 9 mg/dL (ref 6–20)
CO2: 26 mmol/L (ref 22–32)
Calcium: 9 mg/dL (ref 8.9–10.3)
Chloride: 104 mmol/L (ref 98–111)
Creatinine, Ser: 0.94 mg/dL (ref 0.61–1.24)
GFR, Estimated: >60 mL/min
Glucose, Bld: 99 mg/dL (ref 70–99)
Potassium: 3.7 mmol/L (ref 3.5–5.1)
Sodium: 141 mmol/L (ref 135–145)

## 2021-10-29 LAB — CBG MONITORING, ED: Glucose-Capillary: 113 mg/dL — ABNORMAL HIGH (ref 70–99)

## 2021-10-29 NOTE — ED Triage Notes (Signed)
Pt in via POV, reports while at work, sudden onset dizziness, headache, feeling as he was going to pass out.  States, "Everything is spinning."  Denies hx of vertigo.  NAD noted at this time.

## 2021-10-30 ENCOUNTER — Emergency Department
Admission: EM | Admit: 2021-10-30 | Discharge: 2021-10-30 | Disposition: A | Discharge status: Home / Self Care | Payer: Self-pay | Attending: Emergency Medicine | Admitting: Emergency Medicine

## 2021-10-30 DIAGNOSIS — R42 Dizziness and giddiness: Secondary | ICD-10-CM

## 2021-10-30 LAB — CK: Total CK: 121 U/L (ref 49–397)

## 2021-10-30 LAB — TROPONIN I (HIGH SENSITIVITY)
Troponin I (High Sensitivity): <2 ng/L (ref <18)
Troponin I (High Sensitivity): 3 ng/L (ref <18)

## 2021-10-30 MED ORDER — SODIUM CHLORIDE 0.9 % IV BOLUS
1000.0000 mL | Freq: Once | INTRAVENOUS | Status: AC
Start: 2021-10-30 — End: 2021-10-30
  Administered 2021-10-30: 1000 mL via INTRAVENOUS

## 2021-10-30 NOTE — ED Provider Notes (Signed)
[facility] Provider Note    Event Date/Time   First MD Initiated Contact with Patient 10/30/21 [phone]     (approximate)   History   Near Syncope   HPI  Joseph Keller is a 26 y.o. male who presents to the ED from home with a chief complaint of near syncope.  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  Denies vision changes, neck pain, chest pain, shortness of breath, abdominal pain, nausea or vomiting.  Feels dehydrated.     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 Authorizing Provider  albuterol (PROVENTIL HFA;VENTOLIN HFA) 108 (90 Base) MCG/ACT inhaler Inhale 2 puffs into the lungs every 6 (six) hours as needed for wheezing or shortness of breath. 02/14/17   Merlyn Lot, MD  cetirizine (ZYRTEC) 10 MG tablet Take 4 tablets (40 mg total) by mouth daily for 5 days. 06/12/21 06/17/21  Cuthriell, Charline Bills, PA-C  divalproex (DEPAKOTE) 250 MG DR tablet Take 250 mg by mouth 2 (two) times daily. 04/27/16   [provider]  escitalopram (LEXAPRO) 10 MG tablet Take 1 tablet (10 mg total) by mouth at bedtime. 03/05/15   Saez-Benito, Roxy Manns, MD  trimethoprim-polymyxin b (POLYTRIM) ophthalmic solution Place 1  drop into the left eye every 4 (four) hours. 08/15/21   Naaman Plummer, MD     Allergies  Patient has no known allergies.   Family History  No family history on file.   Physical Exam  Triage Vital Signs: ED Triage Vitals  Enc Vitals Group     BP 10/29/21 2206 123/79     Pulse Rate 10/29/21 2206 74     Resp 10/29/21 2206 17     Temp 10/29/21 2206 (!) 97.5 F (36.4 C)     Temp Source 10/29/21 2206 Oral     SpO2 10/29/21 2206 99 %     Weight 10/29/21 2210 140 lb (63.5 kg)     Height 10/29/21 2210 5' (1.524 m)     Head Circumference --      Peak Flow --      Pain Score 10/29/21 2210 10     Pain Loc --      Pain Edu? --      Excl. in [place]? --     Updated Vital Signs: BP (!) 102/58   Pulse (!) 58   Temp 97.6 F (36.4 C)   Resp 12   Ht 5' (1.524 m)   Wt 63.5 kg   SpO2 98%   BMI 27.34 kg/m    General: Awake, no distress. CV:  RRR.  Good peripheral perfusion.  Resp:  Normal effort.  CTA B.  Abd:  Nontender.  No distention.  Other:  PERRL.  EOMI.  No carotid bruits.  Supple neck without meningismus.  Alert and oriented x3.  CN II-XII grossly intact.  5/5 motor strength and sensation all extremities.   ED Results / Procedures / Treatments  Labs (all labs ordered are listed, but only abnormal results are displayed) Labs Reviewed  CBC - Abnormal; Notable for the following components:      Result Value   HCT 38.2 (*)    All other components within normal limits  URINALYSIS, ROUTINE W REFLEX MICROSCOPIC - Abnormal; Notable for the following components:   Color, Urine YELLOW (*)    APPearance CLEAR (*)    All other components within normal limits  CBG MONITORING, ED - Abnormal; Notable for the following components:   Glucose-Capillary 113 (*)    All other components within normal limits  BASIC METABOLIC PANEL  CK  TROPONIN I (HIGH SENSITIVITY)  TROPONIN I (HIGH SENSITIVITY)     EKG  ED ECG REPORT I, Olene Godfrey J, the attending physician, personally viewed and  interpreted this ECG.   Date: 10/30/2021  EKG Time: 2216  Rate: 69  Rhythm: normal sinus rhythm  Axis: Normal  Intervals:none  ST&T Change: Nonspecific    RADIOLOGY None   Official radiology report(s): No results found.   PROCEDURES:  Critical Care performed: No  .1-3 Lead EKG Interpretation  Performed by: Paulette Blanch, MD Authorized by: Paulette Blanch, MD     Interpretation: normal     ECG rate:  70   ECG rate assessment: normal     Rhythm: sinus rhythm     Ectopy: none     Conduction: normal   Comments:     Patient placed on cardiac monitor to evaluate for arrhythmias    MEDICATIONS ORDERED IN ED: Medications  sodium chloride 0.9 % bolus 1,000 mL (0 mLs Intravenous Stopped 10/30/21 0630)     IMPRESSION / MDM / ASSESSMENT AND PLAN / ED COURSE  I reviewed the triage vital signs and the nursing notes.                             26 year old male presenting with dizziness and headache.  Differential diagnosis includes but is not limited to [place], ACS, metabolic, infectious etiologies, etc.  I have personally reviewed patient's records and note a neurology office visit on 06/10/2021 for migraines.  Patient's presentation is most consistent with acute complicated illness / injury requiring diagnostic workup.  The patient is on the cardiac monitor to evaluate for evidence of arrhythmia and/or significant heart rate changes.  Laboratory results demonstrate normal WBC 6.9, normal electrolytes and troponin.  Urine is negative.  Will obtain orthostatic vital signs, add CK.  Initiate IV fluid hydration.  Patient states headache is similar to his previous cluster headaches and does not feel he needs a CT head to further evaluate.  Will trial 15 minutes of nonrebreather oxygen.  Will reassess.  Clinical Course as of 10/30/21 [id number]  Sat Oct 30, 2021  0650 Updated patient and spouse on negative CK results.  Headache resolved after administration of oxygen.  Feel patient is safe  for discharge home after completion of IV fluids.  Strict return precautions given.  Patient and spouse verbalized understanding agree with plan of care. [JS]    Clinical Course User Index [JS] Paulette Blanch, MD     FINAL CLINICAL IMPRESSION(S) / ED DIAGNOSES  Final diagnoses:  Dizziness     Rx / DC Orders   ED Discharge Orders     None        Note:  This document was prepared using Dragon voice recognition software and may include unintentional dictation errors.   Paulette Blanch, MD 10/30/21 [phone]

## 2021-10-30 NOTE — Discharge Instructions (Signed)
Drink plenty of fluids daily.  Return to the ER for worsening symptoms, persistent vomiting, difficulty breathing or other concerns. °

## 2021-12-09 ENCOUNTER — Emergency Department
Admission: EM | Admit: 2021-12-09 | Discharge: 2021-12-09 | Disposition: A | Discharge status: Home / Self Care | Payer: Self-pay | Attending: Emergency Medicine | Admitting: Emergency Medicine

## 2021-12-09 ENCOUNTER — Other Ambulatory Visit: Payer: Self-pay

## 2021-12-09 DIAGNOSIS — B9789 Other viral agents as the cause of diseases classified elsewhere: Secondary | ICD-10-CM | POA: Insufficient documentation

## 2021-12-09 DIAGNOSIS — J45909 Unspecified asthma, uncomplicated: Secondary | ICD-10-CM | POA: Insufficient documentation

## 2021-12-09 DIAGNOSIS — J069 Acute upper respiratory infection, unspecified: Secondary | ICD-10-CM | POA: Insufficient documentation

## 2021-12-09 DIAGNOSIS — Z20822 Contact with and (suspected) exposure to covid-19: Secondary | ICD-10-CM | POA: Insufficient documentation

## 2021-12-09 LAB — RESP PANEL BY RT-PCR (FLU A&B, COVID) ARPGX2
Influenza A by PCR: NEGATIVE
Influenza B by PCR: NEGATIVE
SARS Coronavirus 2 by RT PCR: NEGATIVE

## 2021-12-09 LAB — GROUP A STREP BY PCR: Group A Strep by PCR: NOT DETECTED

## 2021-12-09 MED ORDER — DEXAMETHASONE 10 MG/ML FOR PEDIATRIC ORAL USE
10.0000 mg | Freq: Once | INTRAMUSCULAR | Status: AC
  Administered 2021-12-09: 10 mg via ORAL
  Filled 2021-12-09: qty 1

## 2021-12-09 NOTE — ED Notes (Addendum)
Per lab they state they do not have strep specimen and will need a new one to be run. ED Provider notified.

## 2021-12-09 NOTE — ED Notes (Signed)
See triage note  Presents with headache and scratchy throat  Also feels like he is having some pressure in throat Afebrile on arrival

## 2021-12-09 NOTE — ED Triage Notes (Signed)
Pt comes in with complaining of pressure in his throat, headache and runny nose for 2 days. Pt states that his voice is scratchy. Pt took ibuprofen for pain around 4:30 pm Pt also states that a co-worker has been out of work sick for a few days. Pt has a history of cluster migraines. Pt NAD at this time.

## 2021-12-09 NOTE — ED Provider Notes (Signed)
[facility] Provider Note    Event Date/Time   First MD Initiated Contact with Patient 12/09/21 1820     (approximate)   History   Chief Complaint Sore Throat and Headache   HPI Joseph Keller is a 26 y.o. male, history of MDD, asthma, ADHD, who presents to the emergency department for evaluation of sore throat.  Patient states that his experiencing sore throat, headache, runny nose for the past 2 days.  He states that other coworkers have also been experiencing similar symptoms as well.  Reports a scratchy throat that has developed as well.  Denies fever/chills, chest pain, shortness of breath, abdominal pain, flank pain, nausea/vomiting, dizziness/lightheadedness, rash/lesions, neck pain, dysuria, vision changes, or hearing changes.  History Limitations: No limitations.        Physical Exam  Triage Vital Signs: ED Triage Vitals  Enc Vitals Group     BP 12/09/21 1742 125/77     Pulse Rate 12/09/21 1742 91     Resp 12/09/21 1742 18     Temp 12/09/21 1742 98.3 F (36.8 C)     Temp Source 12/09/21 1742 Oral     SpO2 12/09/21 1742 98 %     Weight 12/09/21 1744 150 lb (68 kg)     Height 12/09/21 1744 5' (1.524 m)     Head Circumference --      Peak Flow --      Pain Score 12/09/21 1743 6     Pain Loc --      Pain Edu? --      Excl. in [place]? --     Most recent vital signs: Vitals:   12/09/21 1742  BP: 125/77  Pulse: 91  Resp: 18  Temp: 98.3 F (36.8 C)  SpO2: 98%    General: Awake, NAD.  Skin: Warm, dry. No rashes or lesions.  Eyes: PERRL. Conjunctivae normal.  CV: Good peripheral perfusion.  Resp: Normal effort.  Lung sounds are clear bilaterally. Abd: Soft, non-tender. No distention.  Neuro: At baseline. No gross neurological deficits.  Musculoskeletal: Normal ROM of all extremities.  Focused Exam: Mildly swollen tonsils bilaterally.  Exquisitely erythematous throat.  Uvula midline.  No trismus or drooling.  Mild anterior cervical  lymphadenopathy present.   Physical Exam    ED Results / Procedures / Treatments  Labs (all labs ordered are listed, but only abnormal results are displayed) Labs Reviewed  RESP PANEL BY RT-PCR (FLU A&B, COVID) [id number]  GROUP A STREP BY PCR  SARS CORONAVIRUS 2 BY RT PCR     EKG N/A.    RADIOLOGY  ED Provider Interpretation: N/A.  No results found.  PROCEDURES:  Critical Care performed: N/A.  Procedures    MEDICATIONS ORDERED IN ED: Medications  dexamethasone (DECADRON) 10 MG/ML injection for Pediatric ORAL use 10 mg (10 mg Oral Given 12/09/21 1847)     IMPRESSION / MDM / ASSESSMENT AND PLAN / ED COURSE  I reviewed the triage vital signs and the nursing notes.                              Differential diagnosis includes, but is not limited to, viral URI, strep pharyngitis, tonsillitis, COVID-19, influenza.  Assessment/Plan Patient presents with sore throat, headache, runny nose x2 days.  Physical exam shows mild tonsillar swelling with exquisitely erythematous posterior pharynx.  Treated here with dexamethasone.  Respiratory panel negative for COVID-19 or influenza.  Strep  PCR negative.  Advised him that he likely developed a viral upper respiratory infection.  Encouraged over-the-counter medications as needed.  Provided him with a note for work.  Will discharge.  Provided the patient with anticipatory guidance, return precautions, and educational material. Encouraged the patient to return to the emergency department at any time if they begin to experience any new or worsening symptoms. Patient expressed understanding and agreed with the plan.   Patient's presentation is most consistent with acute complicated illness / injury requiring diagnostic workup.       FINAL CLINICAL IMPRESSION(S) / ED DIAGNOSES   Final diagnoses:  Viral URI     Rx / DC Orders   ED Discharge Orders     None        Note:  This document was prepared using Dragon voice  recognition software and may include unintentional dictation errors.   Varney Daily, [place] 12/09/21 2043    Merwyn Katos, MD 12/09/21 2351

## 2021-12-09 NOTE — Discharge Instructions (Addendum)
-  Recommend over-the-counter medications as needed to help manage her symptoms.  This includes Tylenol, ibuprofen, and Cepacol lozenges as needed.  -Follow-up with your primary care provider as needed.  -Return to the emergency department anytime if you begin to experience any new or worsening symptoms.

## 2021-12-09 NOTE — ED Provider Triage Note (Signed)
Emergency Medicine Provider Triage Evaluation Note  Joseph Keller, a 26 y.o. male  was evaluated in triage.  Pt complains of sore throat, fevers, and sinus congestion.  He reports a Tmax today at 43 F.  He also reports similar symptoms in his coworkers.  He did  Review of Systems  Positive: Fevers, sore throat Negative: NVD  Physical Exam  There were no vitals taken for this visit. Gen:   Awake, no distress  NAD Resp:  Normal effort CTA MSK:   Moves extremities without difficulty  Other:    Medical Decision Making  Medically screening exam initiated at 5:42 PM.  Appropriate orders placed.  Joseph Keller was informed that the remainder of the evaluation will be completed by another provider, this initial triage assessment does not replace that evaluation, and the importance of remaining in the ED until their evaluation is complete.  Patient to the ED for evaluation of fevers, sore throat, and congestion.   Lissa Hoard, PA-C 12/09/21 1743

## 2022-09-16 ENCOUNTER — Encounter: Payer: Self-pay | Admitting: Medical Oncology

## 2022-09-16 ENCOUNTER — Other Ambulatory Visit: Payer: Self-pay

## 2022-09-16 ENCOUNTER — Emergency Department
Admission: EM | Admit: 2022-09-16 | Discharge: 2022-09-16 | Disposition: A | Discharge status: Home / Self Care | Payer: Self-pay | Attending: Emergency Medicine | Admitting: Emergency Medicine

## 2022-09-16 DIAGNOSIS — R110 Nausea: Secondary | ICD-10-CM | POA: Insufficient documentation

## 2022-09-16 DIAGNOSIS — R1031 Right lower quadrant pain: Secondary | ICD-10-CM | POA: Insufficient documentation

## 2022-09-16 DIAGNOSIS — Z5321 Procedure and treatment not carried out due to patient leaving prior to being seen by health care provider: Secondary | ICD-10-CM | POA: Insufficient documentation

## 2022-09-16 LAB — URINALYSIS, ROUTINE W REFLEX MICROSCOPIC
Bilirubin Urine: NEGATIVE
Glucose, UA: NEGATIVE mg/dL
Hgb urine dipstick: NEGATIVE
Ketones, ur: NEGATIVE mg/dL
Leukocytes,Ua: NEGATIVE
Nitrite: NEGATIVE
Protein, ur: NEGATIVE mg/dL
Specific Gravity, Urine: 1.023 (ref 1.005–1.030)
pH: 5 (ref 5.0–8.0)

## 2022-09-16 LAB — COMPREHENSIVE METABOLIC PANEL
ALT: 29 U/L (ref 0–44)
AST: 22 U/L (ref 15–41)
Albumin: 4.4 g/dL (ref 3.5–5.0)
Alkaline Phosphatase: 69 U/L (ref 38–126)
Anion gap: 10 (ref 5–15)
BUN: 14 mg/dL (ref 6–20)
CO2: 24 mmol/L (ref 22–32)
Calcium: 9.8 mg/dL (ref 8.9–10.3)
Chloride: 102 mmol/L (ref 98–111)
Creatinine, Ser: 1.21 mg/dL (ref 0.61–1.24)
GFR, Estimated: >60 mL/min (ref >60)
Glucose, Bld: 110 mg/dL — ABNORMAL HIGH (ref 70–99)
Potassium: 4.4 mmol/L (ref 3.5–5.1)
Sodium: 136 mmol/L (ref 135–145)
Total Bilirubin: 1.2 mg/dL (ref 0.3–1.2)
Total Protein: 7.7 g/dL (ref 6.5–8.1)

## 2022-09-16 LAB — CBC
HCT: 39.3 % (ref 39.0–52.0)
Hemoglobin: 13.7 g/dL (ref 13.0–17.0)
MCH: 29.2 pg (ref 26.0–34.0)
MCHC: 34.9 g/dL (ref 30.0–36.0)
MCV: 83.8 fL (ref 80.0–100.0)
Platelets: 275 10*3/uL (ref 150–400)
RBC: 4.69 MIL/uL (ref 4.22–5.81)
RDW: 13.2 % (ref 11.5–15.5)
WBC: 7.1 10*3/uL (ref 4.0–10.5)
nRBC: 0 % (ref 0.0–0.2)

## 2022-09-16 LAB — LIPASE, BLOOD: Lipase: 29 U/L (ref 11–51)

## 2022-09-16 NOTE — ED Triage Notes (Signed)
Pt reports that he has been having rt lower abd pain x 3 days. Only reports nausea. Pt denies fever.

## 2022-09-19 ENCOUNTER — Emergency Department: Payer: Self-pay

## 2022-09-19 ENCOUNTER — Emergency Department
Admission: EM | Admit: 2022-09-19 | Discharge: 2022-09-19 | Disposition: A | Discharge status: Home / Self Care | Payer: Self-pay | Attending: Emergency Medicine | Admitting: Emergency Medicine

## 2022-09-19 ENCOUNTER — Other Ambulatory Visit: Payer: Self-pay

## 2022-09-19 DIAGNOSIS — R1031 Right lower quadrant pain: Secondary | ICD-10-CM | POA: Insufficient documentation

## 2022-09-19 LAB — URINALYSIS, ROUTINE W REFLEX MICROSCOPIC
Bilirubin Urine: NEGATIVE
Glucose, UA: NEGATIVE mg/dL
Hgb urine dipstick: NEGATIVE
Ketones, ur: NEGATIVE mg/dL
Leukocytes,Ua: NEGATIVE
Nitrite: NEGATIVE
Protein, ur: NEGATIVE mg/dL
Specific Gravity, Urine: 1.02 (ref 1.005–1.030)
pH: 5 (ref 5.0–8.0)

## 2022-09-19 LAB — COMPREHENSIVE METABOLIC PANEL
ALT: 23 U/L (ref 0–44)
AST: 24 U/L (ref 15–41)
Albumin: 4.3 g/dL (ref 3.5–5.0)
Alkaline Phosphatase: 70 U/L (ref 38–126)
Anion gap: 6 (ref 5–15)
BUN: 11 mg/dL (ref 6–20)
CO2: 25 mmol/L (ref 22–32)
Calcium: 9.4 mg/dL (ref 8.9–10.3)
Chloride: 109 mmol/L (ref 98–111)
Creatinine, Ser: 1.04 mg/dL (ref 0.61–1.24)
GFR, Estimated: >60 mL/min (ref >60)
Glucose, Bld: 115 mg/dL — ABNORMAL HIGH (ref 70–99)
Potassium: 3.5 mmol/L (ref 3.5–5.1)
Sodium: 140 mmol/L (ref 135–145)
Total Bilirubin: 1.1 mg/dL (ref 0.3–1.2)
Total Protein: 7.5 g/dL (ref 6.5–8.1)

## 2022-09-19 LAB — CBC
HCT: 38.2 % — ABNORMAL LOW (ref 39.0–52.0)
Hemoglobin: 13.4 g/dL (ref 13.0–17.0)
MCH: 29 pg (ref 26.0–34.0)
MCHC: 35.1 g/dL (ref 30.0–36.0)
MCV: 82.7 fL (ref 80.0–100.0)
Platelets: 261 10*3/uL (ref 150–400)
RBC: 4.62 MIL/uL (ref 4.22–5.81)
RDW: 13.2 % (ref 11.5–15.5)
WBC: 5.5 10*3/uL (ref 4.0–10.5)
nRBC: 0 % (ref 0.0–0.2)

## 2022-09-19 LAB — LIPASE, BLOOD: Lipase: 28 U/L (ref 11–51)

## 2022-09-19 MED ORDER — ONDANSETRON HCL 4 MG/2ML IJ SOLN
4.0000 mg | Freq: Once | INTRAMUSCULAR | Status: AC
  Administered 2022-09-19: 4 mg via INTRAVENOUS
  Filled 2022-09-19: qty 2

## 2022-09-19 MED ORDER — KETOROLAC TROMETHAMINE 30 MG/ML IJ SOLN
30.0000 mg | Freq: Once | INTRAMUSCULAR | Status: AC
  Administered 2022-09-19: 30 mg via INTRAVENOUS
  Filled 2022-09-19: qty 1

## 2022-09-19 MED ORDER — IOHEXOL 300 MG/ML  SOLN
100.0000 mL | Freq: Once | INTRAMUSCULAR | Status: AC | PRN
  Administered 2022-09-19: 100 mL via INTRAVENOUS

## 2022-09-19 MED ORDER — MORPHINE SULFATE (PF) 4 MG/ML IV SOLN
4.0000 mg | Freq: Once | INTRAVENOUS | Status: AC
  Administered 2022-09-19: 4 mg via INTRAVENOUS
  Filled 2022-09-19: qty 1

## 2022-09-19 NOTE — ED Provider Notes (Signed)
Care assumed of patient from outgoing provider.  See their note for initial history, exam and plan.  Clinical Course as of 09/19/22 1556  Mon Sep 19, 2022  1538 RLQ abd pain - Ct pending.  [SM]    Clinical Course User Index [SM] Corena Herter, MD   CT abdomen and pelvis read as normal appendix.  Borderline wall thickening to the distal transverse and descending colon possible low-grade colitis not excluded.  Fat disposition to the terminal ileum which could be possibly secondary to inflammatory bowel disease but no other symptoms of IBD.  Discussed close follow-up with primary care physician and given return precautions to the emergency department for any worsening symptoms.   Corena Herter, MD 09/19/22 1556

## 2022-09-19 NOTE — ED Triage Notes (Signed)
Pt presents to ED with c/o of RLQ pain, pt states  hewas here on Friday but left due to wait. Pt still has appendix.    of fentanyl 18G R AC

## 2022-09-19 NOTE — ED Provider Notes (Signed)
[facility] Provider Note    Event Date/Time   First MD Initiated Contact with Patient 09/19/22 1220     (approximate)   History   Abdominal Pain   HPI  Joseph Keller is a 27 y.o. male who presents with right lower quadrant abdominal pain, no history of abdominal surgery this has been ongoing for about 3 days now.  No vomiting, normal stools.     Physical Exam   Triage Vital Signs: ED Triage Vitals  Encounter Vitals Group     BP 09/19/22 1116 121/79     Systolic BP Percentile --      Diastolic BP Percentile --      Pulse Rate 09/19/22 1116 95     Resp 09/19/22 1116 17     Temp 09/19/22 1116 98.1 F (36.7 C)     Temp Source 09/19/22 1116 Oral     SpO2 09/19/22 1129 98 %     Weight 09/19/22 1127 69.4 kg (153 lb)     Height 09/19/22 1127 1.524 m (5')     Head Circumference --      Peak Flow --      Pain Score 09/19/22 1126 7     Pain Loc --      Pain Education --      Exclude from Growth Chart --     Most recent vital signs: Vitals:   09/19/22 1116 09/19/22 1129  BP: 121/79   Pulse: 95   Resp: 17   Temp: 98.1 F (36.7 C)   SpO2:  98%     General: Awake, no distress.  CV:  Good peripheral perfusion.  Resp:  Normal effort.  Abd:  No distention.  Tenderness to palpation the right lower quadrant, no CVA tenderness Other:     ED Results / Procedures / Treatments   Labs (all labs ordered are listed, but only abnormal results are displayed) Labs Reviewed  COMPREHENSIVE METABOLIC PANEL - Abnormal; Notable for the following components:      Result Value   Glucose, Bld 115 (*)    All other components within normal limits  CBC - Abnormal; Notable for the following components:   HCT 38.2 (*)    All other components within normal limits  URINALYSIS, ROUTINE W REFLEX MICROSCOPIC - Abnormal; Notable for the following components:   Color, Urine YELLOW (*)    APPearance CLEAR (*)    All other components within normal limits   LIPASE, BLOOD     EKG     RADIOLOGY     PROCEDURES:  Critical Care performed:   Procedures   MEDICATIONS ORDERED IN ED: Medications  ketorolac (TORADOL) 30 MG/ML injection 30 mg (has no administration in time range)  morphine (PF) 4 MG/ML injection 4 mg (4 mg Intravenous Given 09/19/22 1301)  ondansetron (ZOFRAN) injection 4 mg (4 mg Intravenous Given 09/19/22 1301)  iohexol (OMNIPAQUE) 300 MG/ML solution 100 mL (100 mLs Intravenous Contrast Given 09/19/22 1304)     IMPRESSION / MDM / ASSESSMENT AND PLAN / ED COURSE  I reviewed the triage vital signs and the nursing notes. Patient's presentation is most consistent with acute presentation with potential threat to life or bodily function.  Patient presents with right lower quad abdominal pain as detailed above, differential includes appendicitis, ureterolithiasis, UTI, colitis  Lab work reviewed and is overall reassuring, patient does have significant tenderness in the right lower quadrant, will send for CT abdomen pelvis  ----------------------------------------- 3:36 PM on  09/19/2022 ----------------------------------------- Significant delay in CT read, have asked my colleague to follow up on CT results       FINAL CLINICAL IMPRESSION(S) / ED DIAGNOSES   Final diagnoses:  Right lower quadrant abdominal pain     Rx / DC Orders   ED Discharge Orders     None        Note:  This document was prepared using Dragon voice recognition software and may include unintentional dictation errors.   Jene Every, MD 09/19/22 1537

## 2022-09-19 NOTE — Discharge Instructions (Addendum)
You were seen in the emergency department for abdominal pain.  Your labs were normal.  You had a CT scan that showed some mild inflammation of your colon but no signs of a significant infection.  You had a normal appendix.  You had some findings that can sometimes be seen with inflammatory bowel disease.  It is important that you follow-up with your primary care physician.  If you have recurrent or ongoing symptoms you may need to be seen by a GI specialist for further testing for inflammatory bowel disorder.  Return to the emergency department if you have any worsening symptoms.

## 2022-09-22 ENCOUNTER — Encounter: Payer: Self-pay | Admitting: Physician Assistant

## 2022-09-22 ENCOUNTER — Ambulatory Visit (INDEPENDENT_AMBULATORY_CARE_PROVIDER_SITE_OTHER): Payer: Self-pay | Admitting: Physician Assistant

## 2022-09-22 VITALS — BP 118/78 | HR 67 | Temp 98.0°F | Ht 60.0 in | Wt 160.0 lb

## 2022-09-22 DIAGNOSIS — F322 Major depressive disorder, single episode, severe without psychotic features: Secondary | ICD-10-CM

## 2022-09-22 DIAGNOSIS — M791 Myalgia, unspecified site: Secondary | ICD-10-CM

## 2022-09-22 DIAGNOSIS — R1031 Right lower quadrant pain: Secondary | ICD-10-CM

## 2022-09-22 MED ORDER — METHOCARBAMOL 750 MG PO TABS
750.0000 mg | ORAL_TABLET | Freq: Three times a day (TID) | ORAL | 2 refills | Status: AC | PRN
Start: 2022-09-22 — End: ?

## 2022-09-22 MED ORDER — VENLAFAXINE HCL ER 37.5 MG PO CP24
37.5000 mg | ORAL_CAPSULE | Freq: Every day | ORAL | 2 refills | Status: AC
Start: 2022-09-22 — End: ?

## 2022-09-22 NOTE — Patient Instructions (Signed)
-  It was a pleasure to see you today! Please review your visit summary for helpful information -Lab results are usually available within 1-2 days and we will call once reviewed -I would encourage you to follow your care via MyChart where you can access lab results, notes, messages, and more -If you feel that we did a nice job today, please complete your after-visit survey and leave us a Google review! Your CMA today was Kieandra and your provider was Dan Waddell, PA-C, DMSc  

## 2022-09-22 NOTE — Progress Notes (Signed)
Date:  09/22/2022   Name:  Joseph Keller   DOB:  08-Apr-1995   MRN:  [id number]   Chief Complaint: Establish Care, Muscle Pain (Calves are cramping, when massaging it shoots a pain up right leg to hip ), and RLQ pain  Hip Pain  Incident onset: X 1 week. There was no injury mechanism. The pain is present in the right hip. The quality of the pain is described as cramping and aching. The pain is at a severity of 8/10. The pain is moderate. The pain has been Constant since onset. Associated symptoms include a loss of motion. He reports no foreign bodies present. The symptoms are aggravated by movement, weight bearing and palpation. He has tried ice, NSAIDs and acetaminophen for the symptoms. The treatment provided no relief.   Joseph is a pleasant 27 year old male with no significant past medical history who presents with his wife Lyla Son new to the clinic today to establish care and discuss recent concerns for acute onset severe right lower quadrant pain for the past 1 week which began suddenly when he went to urinate.  The pain is highly localized and affecting gait.  Patient mentions strong family history of appendicitis, not sure if related.  Appetite remains unchanged, no nausea or vomiting.  No documented fever.  In addition to the right lower quadrant pain, he also has severe myalgia and tenderness of bilateral calves and stiffness with his walk. Denies any cognitive changes, and feels fairly well at rest.  BM normal, no blood in the stool.  He thought he was coming to a GI appointment today.  He was seen 3 days ago in the ED 09/19/2022, initial suspicion for appendicitis, but CT showed normal appendix but mentions an area of possible colitis/IBD in the right lower quadrant with "fat deposition in the wall of the terminal ileum and proximal colon."  Reviewed all labs from this date which were normal including CBC, CMP, urine, and lipase.  Importantly, this is not the first time he has had  these complaints. Chart review shows similar complaint March 2023 with suprapubic pain and associated myalgia -workup negative at the time.  Multiple prior visits for "gastritis" over the years.  Seen in ED 08/15/2020 for bilateral calf pain for several weeks described as constant cramping; no DVT risk factors but ultrasound was performed anyways and normal, CK was also normal at the time.  Going back further, he was seen 08/29/2019 in the ED for periumbilical abdominal pain with vomiting and poor appetite -CT abdomen pelvis was normal.  Medication list has been reviewed and updated.  Current Meds  Medication Sig   nortriptyline (PAMELOR) 10 MG capsule Take 10 mg by mouth at bedtime.   [DISCONTINUED] albuterol (PROVENTIL HFA;VENTOLIN HFA) 108 (90 Base) MCG/ACT inhaler Inhale 2 puffs into the lungs every 6 (six) hours as needed for wheezing or shortness of breath.   [DISCONTINUED] cetirizine (ZYRTEC) 10 MG tablet Take 4 tablets (40 mg total) by mouth daily for 5 days.     Review of Systems  Constitutional:  Positive for fatigue. Negative for appetite change, diaphoresis, fever and unexpected weight change.  Respiratory:  Negative for chest tightness and shortness of breath.   Cardiovascular:  Negative for chest pain and palpitations.  Gastrointestinal:  Positive for abdominal pain (RLQ). Negative for blood in stool, diarrhea, nausea and vomiting.  Musculoskeletal:  Positive for gait problem and myalgias.  Psychiatric/Behavioral:  Positive for dysphoric mood. The patient is nervous/anxious.  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   Other affective psychosis    MDD (major depressive  disorder) 02/27/2015   Headache disorder 01/02/2014   Post concussion syndrome 01/02/2014    No Known Allergies  Immunization History  Administered Date(s) Administered   Dtap, Unspecified 05/22/1997, 07/22/1997, 09/24/1997, 09/24/1998, 04/10/2001   HIB (PRP-OMP) 05/22/1997, 07/22/1997, 04/07/1998   HPV Quadrivalent 05/13/2010, 05/26/2011, 12/08/2011   Hep B, Unspecified 08/01/1997, 04/25/1997, 09/24/1997   Hepatitis A, Ped/Adol-2 Dose 04/20/2005, 04/24/2006   IPV 05/22/1997, 07/22/1997, 04/07/1998, 04/10/2001   Influenza Split 11/29/2001, 01/25/2005, 01/27/2006   Influenza, Seasonal, Injecte, Preservative Fre 04/11/2007, 12/10/2007, 03/10/2009, 10/22/2009, 12/21/2010   Influenza,inj,Quad PF,6+ Mos 02/11/2017   Influenza-Unspecified 12/08/2011   MMR 04/07/1998, 04/10/2001   Meningococcal Conjugate 05/13/2010   Pneumococcal Conjugate PCV 7 05/19/1998, 09/24/1998   Tdap 04/30/2009, 07/18/2016   Varicella 04/07/1998, 04/20/2005    Past Surgical History:  Procedure Laterality Date   TONSILLECTOMY      Social History   Tobacco Use   Smoking status: Every Day    Types: E-cigarettes   Smokeless tobacco: Never   Tobacco comments:    Pt reports e-cigarette use "around 5 times a day"  Vaping Use   Vaping status: Every Day   Start date: 02/01/2015   Substances: Nicotine, Flavoring  Substance Use Topics   Alcohol use: No   Drug use: No    Family History  Problem Relation Age of Onset   Hypertension Father    Diabetes Father    Diabetes Paternal Aunt    Cancer Maternal Grandmother    Diabetes Paternal Grandmother         09/22/2022   10:12 AM  GAD 7 : Generalized Anxiety Score  Nervous, Anxious, on Edge 1  Control/stop worrying 2  Worry too much - different things 2  Trouble relaxing 3  Restless 2  Easily annoyed or irritable 1  Afraid - awful might happen 2  Total GAD 7 Score 13  Anxiety Difficulty Extremely difficult       09/22/2022   10:12 AM  Depression  screen PHQ 2/9  Decreased Interest 2  Down, Depressed, Hopeless 3  PHQ - 2 Score 5  Altered sleeping 3  Tired, decreased energy 2  Change in appetite 2  Feeling bad or failure about yourself  3  Trouble concentrating 1  Moving slowly or fidgety/restless 2  Suicidal thoughts 1  PHQ-9 Score 19  Difficult doing work/chores Extremely dIfficult    BP Readings from Last 3 Encounters:  09/22/22 118/78  09/19/22 121/79  12/09/21 125/77    Wt Readings from Last 3 Encounters:  09/22/22 160 lb (72.6 kg)  09/19/22 153 lb (69.4 kg)  12/09/21 150 lb (68 kg)    BP 118/78   Pulse 67   Temp 98 F (36.7 C) (Oral)   Ht 5' (1.524 m)   Wt 160 lb (72.6 kg)   SpO2 98%   BMI 31.25 kg/m   Physical Exam Vitals and nursing note reviewed.  Constitutional:      Appearance: Normal appearance.  Cardiovascular:     Rate and Rhythm: Normal rate and regular rhythm.     Heart sounds: No murmur heard.    No friction rub.  No gallop.     Comments: Pedal pulses intact, capillary refill less than 2 seconds. Pulmonary:     Effort: Pulmonary effort is normal.     Breath sounds: Normal breath sounds.  Abdominal:     General: Abdomen is flat. Bowel sounds are increased. There is no distension.     Palpations: There is no mass.     Tenderness: There is abdominal tenderness in the right lower quadrant. There is guarding. There is no rebound. Positive signs include McBurney's sign, psoas sign and obturator sign. Negative signs include Murphy's sign and Rovsing's sign.     Hernia: No hernia is present.  Musculoskeletal:        General: Normal range of motion.     Comments: Severe tenderness to palpation of bilateral calves without palpable cord or obvious edema.  No overlying skin changes.  He is ambulatory but gait is shuffling.  Skin:    General: Skin is warm and dry.  Neurological:     Mental Status: He is alert and oriented to person, place, and time.  Psychiatric:        Mood and Affect: Mood and  affect normal.     Recent Labs     Component Value Date/Time   NA 140 09/19/2022 1121   K 3.5 09/19/2022 1121   CL 109 09/19/2022 1121   CO2 25 09/19/2022 1121   GLUCOSE 115 (H) 09/19/2022 1121   BUN 11 09/19/2022 1121   CREATININE 1.04 09/19/2022 1121   CALCIUM 9.4 09/19/2022 1121   PROT 7.5 09/19/2022 1121   ALBUMIN 4.3 09/19/2022 1121   AST 24 09/19/2022 1121   ALT 23 09/19/2022 1121   ALKPHOS 70 09/19/2022 1121   BILITOT 1.1 09/19/2022 1121   GFRNONAA >60 09/19/2022 1121   GFRAA >60 08/29/2019 0843    Lab Results  Component Value Date   WBC 5.5 09/19/2022   HGB 13.4 09/19/2022   HCT 38.2 (L) 09/19/2022   MCV 82.7 09/19/2022   PLT 261 09/19/2022   No results found for: "HGBA1C" No results found for: "CHOL", "HDL", "LDLCALC", "LDLDIRECT", "TRIG", "CHOLHDL" No results found for: "TSH"   Assessment and Plan:  1. RLQ abdominal pain Exam findings certainly seem like appendicitis, but mostly normal CT scan 3 days ago.  Radiology interpretation did mention possible inflammatory bowel/colitis in the right lower quadrant, so I am referring to GI for additional management.  DDx includes IBD, IBS, appendicitis, pseudoappendicitis, PMR.  Considered nephrolithiasis since pain began with urination, but this would have showed up on CT 3 days ago.  Emphasized the importance of health insurance in this patient who is likely to require specialist workup and management.  I encouraged him to pursue health insurance as soon as possible either through work or Medicaid.  For now we will try to keep the workup as limited as possible to minimize cost while providing appropriate care.  - C-reactive protein - Sedimentation rate - Ambulatory referral to Gastroenterology  2. Myalgia Curious presentation, TTP out of proportion to exam. Checking ESR, CRP and CK today for inflammatory etiology.  Consider also fibromyalgia or muscle spasm.  Chart review shows prescription for muscle relaxers on  multiple occasions, so we will refill Robaxin. - C-reactive protein - Sedimentation rate - CK (Creatine Kinase)  3. Current severe episode of major depressive disorder without psychotic features, unspecified whether recurrent (HCC) Possible psychiatric component to presentation with pain.  Sending Effexor for patient to begin which is likely to help with  his depression, anxiety, and perhaps his abdominal symptoms if there is an IBS component to this presentation. - venlafaxine XR (EFFEXOR-XR) 37.5 MG 24 hr capsule; Take 1 capsule (37.5 mg total) by mouth daily with breakfast.  Dispense: 30 capsule; Refill: 2   F/u TBD pending labs and symptom progression   Alvester Morin, PA-C, DMSc, Nutritionist [facility] [phone]

## 2022-09-23 ENCOUNTER — Telehealth: Payer: Self-pay | Admitting: Gastroenterology

## 2022-09-23 LAB — CK: Total CK: 86 U/L (ref 49–439)

## 2022-09-23 LAB — SEDIMENTATION RATE: Sed Rate: 2 mm/h (ref 0–15)

## 2022-09-23 LAB — C-REACTIVE PROTEIN: CRP: 1 mg/L (ref 0–10)

## 2022-09-23 NOTE — Telephone Encounter (Signed)
Nickie from the PCP office called to setup an urgent appointment. Ginger spoke with Dr. Tobi Bastos and Dr. Allegra Lai and they just wanted to go ahead with the colonoscopy.

## 2022-09-23 NOTE — Telephone Encounter (Signed)
Please review.  KP

## 2022-09-23 NOTE — Telephone Encounter (Signed)
fyi

## 2022-10-12 ENCOUNTER — Other Ambulatory Visit: Payer: Self-pay | Admitting: Physician Assistant

## 2022-10-12 MED ORDER — DICYCLOMINE HCL 20 MG PO TABS: 20.0000 mg | ORAL_TABLET | Freq: Four times a day (QID) | ORAL | 1 refills | Status: AC | PRN

## 2022-10-12 NOTE — Telephone Encounter (Signed)
Please review.  KP

## 2022-10-24 NOTE — Telephone Encounter (Signed)
Please review.  KP

## 2022-11-25 NOTE — Telephone Encounter (Signed)
Please review. Did you ask for this information? Is the patient supposed to give [place] the card number?  KP

## 2022-11-25 NOTE — Telephone Encounter (Signed)
Spoke to wife she will have her husband call the office about insurance questions.

## 2022-11-25 NOTE — Telephone Encounter (Signed)
Do you know if we are in network?  KP

## 2023-02-20 ENCOUNTER — Emergency Department
Admission: EM | Admit: 2023-02-20 | Discharge: 2023-02-20 | Disposition: A | Discharge status: Home / Self Care | Payer: PRIVATE HEALTH INSURANCE | Attending: Emergency Medicine | Admitting: Emergency Medicine

## 2023-02-20 ENCOUNTER — Other Ambulatory Visit: Payer: Self-pay

## 2023-02-20 ENCOUNTER — Emergency Department: Payer: PRIVATE HEALTH INSURANCE

## 2023-02-20 DIAGNOSIS — R059 Cough, unspecified: Secondary | ICD-10-CM | POA: Diagnosis present

## 2023-02-20 DIAGNOSIS — B9789 Other viral agents as the cause of diseases classified elsewhere: Secondary | ICD-10-CM | POA: Insufficient documentation

## 2023-02-20 DIAGNOSIS — J069 Acute upper respiratory infection, unspecified: Secondary | ICD-10-CM | POA: Insufficient documentation

## 2023-02-20 DIAGNOSIS — Z20822 Contact with and (suspected) exposure to covid-19: Secondary | ICD-10-CM | POA: Insufficient documentation

## 2023-02-20 LAB — GROUP A STREP BY PCR: Group A Strep by PCR: NOT DETECTED

## 2023-02-20 LAB — RESP PANEL BY RT-PCR (RSV, FLU A&B, COVID)  RVPGX2
Influenza A by PCR: NEGATIVE
Influenza B by PCR: NEGATIVE
Resp Syncytial Virus by PCR: NEGATIVE
SARS Coronavirus 2 by RT PCR: NEGATIVE

## 2023-02-20 MED ORDER — ALBUTEROL SULFATE HFA 108 (90 BASE) MCG/ACT IN AERS: 2.0000 | INHALATION_SPRAY | Freq: Four times a day (QID) | RESPIRATORY_TRACT | 2 refills | Status: AC | PRN

## 2023-02-20 MED ORDER — IPRATROPIUM-ALBUTEROL 0.5-2.5 (3) MG/3ML IN SOLN
3.0000 mL | Freq: Once | RESPIRATORY_TRACT | Status: AC
  Administered 2023-02-20: 3 mL via RESPIRATORY_TRACT
  Filled 2023-02-20: qty 3

## 2023-02-20 MED ORDER — PREDNISONE 50 MG PO TABS: 50.0000 mg | ORAL_TABLET | Freq: Every day | ORAL | 0 refills | Status: AC

## 2023-02-20 MED ORDER — KETOROLAC TROMETHAMINE 30 MG/ML IJ SOLN
30.0000 mg | Freq: Once | INTRAMUSCULAR | Status: AC
  Administered 2023-02-20: 30 mg via INTRAMUSCULAR
  Filled 2023-02-20: qty 1

## 2023-02-20 MED ORDER — BENZONATATE 100 MG PO CAPS: 100.0000 mg | ORAL_CAPSULE | Freq: Four times a day (QID) | ORAL | 0 refills | Status: AC | PRN

## 2023-02-20 NOTE — ED Triage Notes (Addendum)
Pt states cough and some SOB since Saturday. Pt states sick contacts. Pt endorses sore throat. Pt states family sick. Pt speaking in full sentences. Pt denies fevers.   Wife here with pt.

## 2023-02-20 NOTE — ED Provider Notes (Signed)
[facility] Provider Note    Event Date/Time   First MD Initiated Contact with Patient 02/20/23 [phone]     (approximate)   History   Cough   HPI  Joseph Keller is a 28 y.o. male who presents with complaints of sore throat, body aches, fatigue, cough, mild shortness of breath which started 2 days ago.  He is concerned about possible pneumonia but reports his entire family is sick.     Physical Exam   Triage Vital Signs: ED Triage Vitals [02/20/23 0733]  Encounter Vitals Group     BP (!) 135/91     Systolic BP Percentile      Diastolic BP Percentile      Pulse Rate 96     Resp 18     Temp 98.5 F (36.9 C)     Temp Source Oral     SpO2 98 %     Weight 68 kg (150 lb)     Height 1.524 m (5')     Head Circumference      Peak Flow      Pain Score 0     Pain Loc      Pain Education      Exclude from Growth Chart     Most recent vital signs: Vitals:   02/20/23 0733  BP: (!) 135/91  Pulse: 96  Resp: 18  Temp: 98.5 F (36.9 C)  SpO2: 98%     General: Awake, no distress.  CV:  Good peripheral perfusion.  Resp:  Normal effort.  Scattered mild wheezes Abd:  No distention.  Other:     ED Results / Procedures / Treatments   Labs (all labs ordered are listed, but only abnormal results are displayed) Labs Reviewed  RESP PANEL BY RT-PCR (RSV, FLU A&B, COVID)  [id number]  GROUP A STREP BY PCR     EKG     RADIOLOGY Chest x-ray viewed interpret by me, no pneumonia, pending radiology review    PROCEDURES:  Critical Care performed:   Procedures   MEDICATIONS ORDERED IN ED: Medications  ketorolac (TORADOL) 30 MG/ML injection 30 mg (30 mg Intramuscular Given 02/20/23 0840)  ipratropium-albuterol (DUONEB) 0.5-2.5 (3) MG/3ML nebulizer solution 3 mL (3 mLs Nebulization Given 02/20/23 0841)     IMPRESSION / MDM / ASSESSMENT AND PLAN / ED COURSE  I reviewed the triage vital signs and the nursing notes. Patient's presentation is  most consistent with acute illness / injury with system symptoms.  Patient presents with symptoms as above, highly suspicious for viral illness, likely influenza versus COVID.  Pneumonia is certainly a possibility,  X-ray is generally reassuring, he does have scattered wheezes on exam, will treat with a DuoNeb, IM Toradol for myalgias, pending respiratory PCR  Chest x-ray is reassuring and patient feeling improved after treatment, respiratory PCR negative, still feel this is likely viral illness, no indication for antibiotics, will prescribe short course of prednisone, albuterol inhaler      FINAL CLINICAL IMPRESSION(S) / ED DIAGNOSES   Final diagnoses:  Viral URI with cough     Rx / DC Orders   ED Discharge Orders          Ordered    benzonatate (TESSALON PERLES) 100 MG capsule  Every 6 hours PRN        02/20/23 0859             Note:  This document was prepared using Dragon voice recognition software and may  include unintentional dictation errors.   Jene Every, MD 02/20/23 [phone]

## 2023-02-22 ENCOUNTER — Telehealth: Payer: Self-pay

## 2023-02-22 NOTE — Transitions of Care (Post Inpatient/ED Visit) (Signed)
   02/22/2023  Name: [place] Lee Bilyk MRN: [id number] DOB: Oct 01, 1995  Today's TOC FU Call Status: Today's TOC FU Call Status:: Unsuccessful Call (1st Attempt) Unsuccessful Call (1st Attempt) Date: 02/22/23  Attempted to reach the patient regarding the most recent Inpatient/ED visit.  Follow Up Plan: Additional outreach attempts will be made to reach the patient to complete the Transitions of Care (Post Inpatient/ED visit) call.   [employer], CMA

## 2023-02-23 ENCOUNTER — Ambulatory Visit: Payer: Self-pay

## 2023-02-23 NOTE — Telephone Encounter (Signed)
Patient called and before he could be transferred to nurse triage, the call dropped. I called him back and the recording stated "the call cannot be completed at this time, try the call again later." The agent said the patient mentioned blood from his nose.Will attempt to call the patient back.

## 2023-02-23 NOTE — Telephone Encounter (Signed)
  Chief Complaint: Blood in nasal mucous when blowing his nose Symptoms: Above Frequency: today 6 times Pertinent Negatives: Patient denies  Disposition: [] ED /[] Urgent Care (no appt availability in office) / [] Appointment(In office/virtual)/ []  [place] Virtual [facility]/ [x] Home Care/ [] Refused Recommended Disposition /[] [facility]/ []  Follow-up with PCP Additional Notes: Pt was seen for an URI on 02/20/2023 and is still taking medications. He is feeling better. Today when he blew his nose there was some blood bubbles in the discharge. This has happened 5 additional times. Small amounts of blood. Pt is concerned. Offered VV. Pt will wait to see if this continues, and call back if needed. Tonight pt will work on humidifying the air to see if that helps.

## 2023-02-23 NOTE — Telephone Encounter (Signed)
Patient called and before he could be transferred to nurse triage, the call dropped. I called him back and the recording stated "the call cannot be completed at this time, try the call again later." The agent said the patient mentioned blood from his nose.Will attempt to call the patient back. Answer Assessment - Initial Assessment Questions 1. AMOUNT OF BLEEDING: "How bad is the bleeding?" "How much blood was lost?" "Has the bleeding stopped?"   - MILD: needed a couple tissues   - MODERATE: needed many tissues   - SEVERE: large blood clots, soaked many tissues, lasted more than 30 minutes      Mild  2. ONSET: "When did the nosebleed start?"      10 am 3. FREQUENCY: "How many nosebleeds have you had in the last 24 hours?"      None really has had blood in mucous  6 times 4. RECURRENT SYMPTOMS: "Have there been other recent nosebleeds?" If Yes, ask: "How long did it take you to stop the bleeding?" "What worked best?"      no 5. CAUSE: "What do you think caused this nosebleed?"     Unsure 6. LOCAL FACTORS: "Do you have any cold symptoms?", "Have you been rubbing or picking at your nose?"     Pt has had URI and is still under treatment 7. SYSTEMIC FACTORS: "Do you have high blood pressure or any bleeding problems?"     no 8. BLOOD THINNERS: "Do you take any blood thinners?" (e.g., aspirin, clopidogrel / Plavix, coumadin, heparin). Notes: Other strong blood thinners include: Arixtra (fondaparinux), Eliquis (apixaban), Pradaxa (dabigatran), and Xarelto (rivaroxaban).     no 9. OTHER SYMPTOMS: "Do you have any other symptoms?" (e.g., lightheadedness)     no  Protocols used: Nosebleed-A-AH

## 2023-02-23 NOTE — Transitions of Care (Post Inpatient/ED Visit) (Signed)
   02/23/2023  Name: Joseph Keller MRN: [id number] DOB: September 25, 1995  Today's TOC FU Call Status: Today's TOC FU Call Status:: Unsuccessful Call (2nd Attempt) Unsuccessful Call (1st Attempt) Date: 02/22/23 Unsuccessful Call (2nd Attempt) Date: 02/23/23  Attempted to reach the patient regarding the most recent Inpatient/ED visit.  Follow Up Plan: Additional outreach attempts will be made to reach the patient to complete the Transitions of Care (Post Inpatient/ED visit) call.   [employer], CMA

## 2023-02-23 NOTE — Telephone Encounter (Signed)
Patient called and before he could be transferred to nurse triage, the call dropped. I called him back and the recording stated "the call cannot be completed at this time, try the call again later." The agent said the patient mentioned blood from his nose.Will attempt to call the patient back.   Called pt on mobile number Left message to return our call.

## 2023-03-27 ENCOUNTER — Other Ambulatory Visit: Payer: Self-pay

## 2023-03-27 ENCOUNTER — Emergency Department: Payer: PRIVATE HEALTH INSURANCE

## 2023-03-27 ENCOUNTER — Emergency Department
Admission: EM | Admit: 2023-03-27 | Discharge: 2023-03-27 | Disposition: A | Discharge status: Home / Self Care | Payer: PRIVATE HEALTH INSURANCE | Attending: Student in an Organized Health Care Education/Training Program | Admitting: Student in an Organized Health Care Education/Training Program

## 2023-03-27 DIAGNOSIS — J45909 Unspecified asthma, uncomplicated: Secondary | ICD-10-CM | POA: Insufficient documentation

## 2023-03-27 DIAGNOSIS — J205 Acute bronchitis due to respiratory syncytial virus: Secondary | ICD-10-CM

## 2023-03-27 DIAGNOSIS — R059 Cough, unspecified: Secondary | ICD-10-CM | POA: Diagnosis present

## 2023-03-27 DIAGNOSIS — J40 Bronchitis, not specified as acute or chronic: Secondary | ICD-10-CM

## 2023-03-27 LAB — RESP PANEL BY RT-PCR (RSV, FLU A&B, COVID)  RVPGX2
Influenza A by PCR: NEGATIVE
Influenza B by PCR: NEGATIVE
Resp Syncytial Virus by PCR: POSITIVE — AB
SARS Coronavirus 2 by RT PCR: NEGATIVE

## 2023-03-27 MED ORDER — PREDNISONE 20 MG PO TABS
40.0000 mg | ORAL_TABLET | Freq: Once | ORAL | Status: AC
  Administered 2023-03-27: 40 mg via ORAL
  Filled 2023-03-27: qty 2

## 2023-03-27 MED ORDER — PREDNISONE 20 MG PO TABS: 40.0000 mg | ORAL_TABLET | Freq: Every day | ORAL | 0 refills | Status: AC

## 2023-03-27 MED ORDER — IPRATROPIUM-ALBUTEROL 0.5-2.5 (3) MG/3ML IN SOLN
3.0000 mL | Freq: Once | RESPIRATORY_TRACT | Status: AC
  Administered 2023-03-27: 3 mL via RESPIRATORY_TRACT
  Filled 2023-03-27: qty 3

## 2023-03-27 NOTE — ED Triage Notes (Signed)
 Pt states cough for 1 week, seen for same last week. Pt states yellow sputum. Pt in distress. Pt endorses vaping for 8 years. NAD noted. Pt states RSV + contact/

## 2023-03-27 NOTE — ED Provider Notes (Signed)
 [facility] Provider Note    Event Date/Time   First MD Initiated Contact with Patient 03/27/23 [phone]     (approximate)   History   Cough   HPI  Joseph Keller is a 28 y.o. male remote history of asthma presents to the ER for evaluation of cough congestion some shortness of breath.  Recently had exposure to RSV.  Was recently put on a course of antibiotics for URI.  Was given albuterol inhaler as well as Tessalon Perles.  He denies any chest pain.  No nausea or vomiting.  Denies any fevers but does have chills.     Physical Exam   Triage Vital Signs: ED Triage Vitals  Encounter Vitals Group     BP 03/27/23 0703 (!) 141/92     Systolic BP Percentile --      Diastolic BP Percentile --      Pulse Rate 03/27/23 0703 98     Resp 03/27/23 0703 16     Temp 03/27/23 0703 98 F (36.7 C)     Temp Source 03/27/23 0703 Oral     SpO2 03/27/23 0703 97 %     Weight 03/27/23 0704 152 lb (68.9 kg)     Height 03/27/23 0704 5' (1.524 m)     Head Circumference --      Peak Flow --      Pain Score 03/27/23 0704 0     Pain Loc --      Pain Education --      Exclude from Growth Chart --     Most recent vital signs: Vitals:   03/27/23 0703 03/27/23 0727  BP: (!) 141/92   Pulse: 98   Resp: 16   Temp: 98 F (36.7 C) 99.3 F (37.4 C)  SpO2: 97%      Constitutional: Alert  Eyes: Conjunctivae are normal.  Head: Atraumatic. Nose: No congestion/rhinnorhea. Mouth/Throat: Mucous membranes are moist.   Neck: Painless ROM.  Cardiovascular:   Good peripheral circulation. Respiratory: Normal respiratory effort.  No retractions.  Coarse breath sounds throughout. Gastrointestinal: Soft and nontender.  Musculoskeletal:  no deformity Neurologic:  MAE spontaneously. No gross focal neurologic deficits are appreciated.  Skin:  Skin is warm, dry and intact. No rash noted. Psychiatric: Mood and affect are normal. Speech and behavior are normal.    ED Results /  Procedures / Treatments   Labs (all labs ordered are listed, but only abnormal results are displayed) Labs Reviewed  RESP PANEL BY RT-PCR (RSV, FLU A&B, COVID)  RVPGX2 - Abnormal; Notable for the following components:      Result Value   Resp Syncytial Virus by PCR POSITIVE (*)    All other components within normal limits     EKG     RADIOLOGY Please see ED Course for my review and interpretation.  I personally reviewed all radiographic images ordered to evaluate for the above acute complaints and reviewed radiology reports and findings.  These findings were personally discussed with the patient.  Please see medical record for radiology report.    PROCEDURES:  Critical Care performed:   Procedures   MEDICATIONS ORDERED IN ED: Medications  ipratropium-albuterol (DUONEB) 0.5-2.5 (3) MG/3ML nebulizer solution 3 mL (3 mLs Nebulization Given 03/27/23 0749)  predniSONE (DELTASONE) tablet 40 mg (40 mg Oral Given 03/27/23 0748)     IMPRESSION / MDM / ASSESSMENT AND PLAN / ED COURSE  I reviewed the triage vital signs and the nursing notes.  Differential diagnosis includes, but is not limited to, URI, asthma, bronchitis, pneumonia, pneumothorax, CHF  Patient presented to the ER for evaluation of symptoms as described above.  Symptoms most consistent with bronchitis.  Will check RSV and flu panel.  Will give nebulizer will give prednisone given symptoms consistent with bronchitis.  Chest x-ray on my review and interpretation without evidence of pneumothorax or dense consolidation.   Clinical Course as of 03/27/23 0818  Mon Mar 27, 2023  0818 Patient RSV positive.  Remains nontoxic.  Does appear appropriate for outpatient follow-up.  Do not feel that further diagnostic testing clinically indicated. [PR]    Clinical Course User Index [PR] Willy Eddy, MD     FINAL CLINICAL IMPRESSION(S) / ED DIAGNOSES   Final diagnoses:  Bronchitis  RSV  bronchitis     Rx / DC Orders   ED Discharge Orders          Ordered    predniSONE (DELTASONE) 20 MG tablet  Daily        03/27/23 0817             Note:  This document was prepared using Dragon voice recognition software and may include unintentional dictation errors.    Willy Eddy, MD 03/27/23 [phone]

## 2023-05-08 ENCOUNTER — Encounter: Payer: Self-pay | Admitting: *Deleted

## 2023-05-08 ENCOUNTER — Emergency Department
Admission: EM | Admit: 2023-05-08 | Discharge: 2023-05-09 | Disposition: A | Discharge status: Home / Self Care | Payer: Self-pay | Attending: Emergency Medicine | Admitting: Emergency Medicine

## 2023-05-08 ENCOUNTER — Emergency Department: Payer: Self-pay

## 2023-05-08 DIAGNOSIS — R1013 Epigastric pain: Secondary | ICD-10-CM

## 2023-05-08 DIAGNOSIS — J45909 Unspecified asthma, uncomplicated: Secondary | ICD-10-CM | POA: Insufficient documentation

## 2023-05-08 DIAGNOSIS — R079 Chest pain, unspecified: Secondary | ICD-10-CM

## 2023-05-08 DIAGNOSIS — K219 Gastro-esophageal reflux disease without esophagitis: Secondary | ICD-10-CM | POA: Insufficient documentation

## 2023-05-08 DIAGNOSIS — D72829 Elevated white blood cell count, unspecified: Secondary | ICD-10-CM | POA: Insufficient documentation

## 2023-05-08 LAB — CBC
HCT: 38 % — ABNORMAL LOW (ref 39.0–52.0)
Hemoglobin: 13.5 g/dL (ref 13.0–17.0)
MCH: 29.4 pg (ref 26.0–34.0)
MCHC: 35.5 g/dL (ref 30.0–36.0)
MCV: 82.8 fL (ref 80.0–100.0)
Platelets: 253 10*3/uL (ref 150–400)
RBC: 4.59 MIL/uL (ref 4.22–5.81)
RDW: 13.1 % (ref 11.5–15.5)
WBC: 12.7 10*3/uL — ABNORMAL HIGH (ref 4.0–10.5)
nRBC: 0 % (ref 0.0–0.2)

## 2023-05-08 LAB — BASIC METABOLIC PANEL WITH GFR
Anion gap: 7 (ref 5–15)
BUN: 11 mg/dL (ref 6–20)
CO2: 26 mmol/L (ref 22–32)
Calcium: 9.1 mg/dL (ref 8.9–10.3)
Chloride: 107 mmol/L (ref 98–111)
Creatinine, Ser: 0.93 mg/dL (ref 0.61–1.24)
GFR, Estimated: >60 mL/min (ref >60)
Glucose, Bld: 95 mg/dL (ref 70–99)
Potassium: 3.6 mmol/L (ref 3.5–5.1)
Sodium: 140 mmol/L (ref 135–145)

## 2023-05-08 LAB — TROPONIN I (HIGH SENSITIVITY): Troponin I (High Sensitivity): <2 ng/L (ref <18)

## 2023-05-08 MED ORDER — FAMOTIDINE IN NACL 20-0.9 MG/50ML-% IV SOLN
20.0000 mg | Freq: Once | INTRAVENOUS | Status: AC
  Administered 2023-05-09: 20 mg via INTRAVENOUS
  Filled 2023-05-08: qty 50

## 2023-05-08 NOTE — ED Triage Notes (Signed)
 Pt ambulatory to triage.  Pt has chest pain for 1 week, pain worse past 3 days.   No sob.  No n/v/d  pt reports pain in left chest. Non radiating pain.  Pt alert

## 2023-05-08 NOTE — ED Provider Notes (Signed)
 [facility] Provider Note    Event Date/Time   First MD Initiated Contact with Patient 05/08/23 2303     (approximate)   History   Chest Pain   HPI {Remember to add pertinent medical, surgical, social, and/or OB history to HPI:1} Joseph Keller is a 28 y.o. male  ***       Past Medical History   Past Medical History:  Diagnosis Date  . ADHD (attention deficit hyperactivity disorder)   . Allergy   . Anxiety   . Asthma   . GERD (gastroesophageal reflux disease)   . Headache      Active Problem List   Patient Active Problem List   Diagnosis Date Noted  . Photophobia 06/10/2021  . Cervical spondylosis 02/17/2021  . Tendinitis of right shoulder 02/17/2021  . ADHD (attention deficit hyperactivity disorder) 02/25/2020  . Asthma without status asthmaticus 02/25/2020  . 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 Other affective psychosis   . MDD (major depressive disorder) 02/27/2015  . Headache disorder 01/02/2014  . Post concussion syndrome 01/02/2014     Past Surgical History   Past Surgical History:  Procedure Laterality Date  . TONSILLECTOMY       Home Medications   Prior to Admission medications   Medication Sig Start Date End Date Taking? Authorizing Provider  albuterol (VENTOLIN HFA) 108 (90 Base) MCG/ACT inhaler Inhale 2 puffs into the lungs every 6 (six) hours as needed for wheezing or shortness of breath. 02/20/23   Jene Every, MD  benzonatate (TESSALON PERLES) 100 MG capsule Take 1 capsule (100 mg total) by mouth every 6 (six) hours as needed for cough. 02/20/23 02/20/24  Jene Every, MD  dicyclomine (BENTYL) 20 MG tablet Take 1 tablet (20 mg total) by mouth 4 (four) times daily as needed (for intestinal spasm). 10/12/22   Remo Lipps, PA  methocarbamol (ROBAXIN) 750 MG tablet Take 1 tablet (750 mg total) by mouth every 8 (eight) hours as needed for muscle spasms (for  calves). May cause drowsiness 09/22/22   Remo Lipps, PA  nortriptyline (PAMELOR) 10 MG capsule Take 10 mg by mouth at bedtime.    [provider]  predniSONE (DELTASONE) 50 MG tablet Take 1 tablet (50 mg total) by mouth daily with breakfast. 02/20/23   Jene Every, MD  venlafaxine XR (EFFEXOR-XR) 37.5 MG 24 hr capsule Take 1 capsule (37.5 mg total) by mouth daily with breakfast. 09/22/22   Remo Lipps, PA     Allergies  Patient has no known allergies.   Family History   Family History  Problem Relation Age of Onset  . Hypertension Father   . Diabetes Father   . Diabetes Paternal Aunt   . Cancer Maternal Grandmother   . Diabetes Paternal Grandmother      Physical Exam  Triage Vital Signs: ED Triage Vitals  Encounter Vitals Group     BP 05/08/23 2008 127/80     Systolic BP Percentile --      Diastolic BP Percentile --      Pulse Rate 05/08/23 2008 80     Resp 05/08/23 2008 18     Temp 05/08/23 2008 98.1 F (36.7 C)     Temp Source 05/08/23 2008 Oral     SpO2 05/08/23 2008 100 %     Weight --      Height --      Head Circumference --  Peak Flow --      Pain Score 05/08/23 2005 8     Pain Loc --      Pain Education --      Exclude from Growth Chart --     Updated Vital Signs: BP 127/80 (BP Location: Left Arm)   Pulse 80   Temp 98.1 F (36.7 C) (Oral)   Resp 18   SpO2 100%   {Only need to document appropriate and relevant physical exam:1} General: Awake, no distress. *** CV:  Good peripheral perfusion. *** Resp:  Normal effort. *** Abd:  No distention. *** Other:  ***   ED Results / Procedures / Treatments  Labs (all labs ordered are listed, but only abnormal results are displayed) Labs Reviewed  CBC - Abnormal; Notable for the following components:      Result Value   WBC 12.7 (*)    HCT 38.0 (*)    All other components within normal limits  BASIC METABOLIC PANEL WITH GFR  HEPATIC FUNCTION PANEL  LIPASE, BLOOD  TROPONIN I  (HIGH SENSITIVITY)  TROPONIN I (HIGH SENSITIVITY)     EKG  ***   RADIOLOGY *** {You MUST document your own interpretation of imaging, as well as the fact that you reviewed the radiologist's report!:1}  Official radiology report(s): DG Chest 2 View Result Date: 05/08/2023 CLINICAL DATA:  Chest pain. EXAM: CHEST - 2 VIEW COMPARISON:  03/27/2023 FINDINGS: The cardiomediastinal contours are normal. The lungs are clear. Pulmonary vasculature is normal. No consolidation, pleural effusion, or pneumothorax. No acute osseous abnormalities are seen. IMPRESSION: Negative radiographs of the chest. Electronically Signed   By: Narda Rutherford M.D.   On: 05/08/2023 22:46     PROCEDURES:  Critical Care performed: {CriticalCareYesNo:19197::"Yes, see critical care procedure note(s)","No"}  Procedures   MEDICATIONS ORDERED IN ED: Medications  famotidine (PEPCID) IVPB 20 mg premix (has no administration in time range)     IMPRESSION / MDM / ASSESSMENT AND PLAN / ED COURSE  I reviewed the triage vital signs and the nursing notes.                              Differential diagnosis includes, but is not limited to, ***  Patient's presentation is most consistent with {EM COPA:27473}  {If the patient is on the monitor, remove the brackets and asterisks on the sentence below and remember to document it as a Procedure as well. Otherwise delete the sentence below:1} {**The patient is on the cardiac monitor to evaluate for evidence of arrhythmia and/or significant heart rate changes.**}  {Remember to include, when applicable, any/all of the following data: independent review of imaging independent review of labs (comment specifically on pertinent positives and negatives) review of specific prior hospitalizations, PCP/specialist notes, etc. discuss meds given and prescribed document any discussion with consultants (including hospitalists) any clinical decision tools you used and why (PECARN,  NEXUS, etc.) did you consider admitting the patient? document social determinants of health affecting patient's care (homelessness, inability to follow up in a timely fashion, etc) document any pre-existing conditions increasing risk on current visit (e.g. diabetes and HTN increasing danger of high-risk chest pain/ACS) describes what meds you gave (especially parenteral) and why any other interventions?:1}      FINAL CLINICAL IMPRESSION(S) / ED DIAGNOSES   Final diagnoses:  Chest pain, unspecified type  Epigastric pain     Rx / DC Orders   ED Discharge Orders  None        Note:  This document was prepared using Dragon voice recognition software and may include unintentional dictation errors.

## 2023-05-09 LAB — LIPASE, BLOOD: Lipase: 27 U/L (ref 11–51)

## 2023-05-09 LAB — HEPATIC FUNCTION PANEL
ALT: 20 U/L (ref 0–44)
AST: 18 U/L (ref 15–41)
Albumin: 4 g/dL (ref 3.5–5.0)
Alkaline Phosphatase: 60 U/L (ref 38–126)
Bilirubin, Direct: 0.1 mg/dL (ref 0.0–0.2)
Indirect Bilirubin: 0.9 mg/dL (ref 0.3–0.9)
Total Bilirubin: 1 mg/dL (ref 0.0–1.2)
Total Protein: 6.7 g/dL (ref 6.5–8.1)

## 2023-05-09 LAB — TROPONIN I (HIGH SENSITIVITY): Troponin I (High Sensitivity): 2 ng/L (ref <18)

## 2023-05-09 MED ORDER — PANTOPRAZOLE SODIUM 40 MG PO TBEC
40.0000 mg | DELAYED_RELEASE_TABLET | Freq: Every day | ORAL | 0 refills | Status: AC
Start: 2023-05-09 — End: 2023-06-08

## 2023-05-09 NOTE — ED Provider Notes (Incomplete)
 [facility] Provider Note    Event Date/Time   First MD Initiated Contact with Patient 05/08/23 2303     (approximate)   History   Chest Pain   HPI  Joseph Keller is a 28 y.o. male who presents to the ED from home with a 1 week history of waxing/waning epigastric/substernal chest tightness, worse on eating.  Denies associated fever/chills, shortness of breath, diaphoresis, palpitations, nausea, vomiting or dizziness.  Denies recent EtOH use.     Past Medical History   Past Medical History:  Diagnosis Date  . ADHD (attention deficit hyperactivity disorder)   . Allergy   . Anxiety   . Asthma   . GERD (gastroesophageal reflux disease)   . Headache      Active Problem List   Patient Active Problem List   Diagnosis Date Noted  . Photophobia 06/10/2021  . Cervical spondylosis 02/17/2021  . Tendinitis of right shoulder 02/17/2021  . ADHD (attention deficit hyperactivity disorder) 02/25/2020  . Asthma without status asthmaticus 02/25/2020  . 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 Other affective psychosis   . MDD (major depressive disorder) 02/27/2015  . Headache disorder 01/02/2014  . Post concussion syndrome 01/02/2014     Past Surgical History   Past Surgical History:  Procedure Laterality Date  . TONSILLECTOMY       Home Medications   Prior to Admission medications   Medication Sig Start Date End Date Taking? Authorizing Provider  albuterol (VENTOLIN HFA) 108 (90 Base) MCG/ACT inhaler Inhale 2 puffs into the lungs every 6 (six) hours as needed for wheezing or shortness of breath. 02/20/23   Jene Every, MD  benzonatate (TESSALON PERLES) 100 MG capsule Take 1 capsule (100 mg total) by mouth every 6 (six) hours as needed for cough. 02/20/23 02/20/24  Jene Every, MD  dicyclomine (BENTYL) 20 MG tablet Take 1 tablet (20 mg total) by mouth 4 (four) times daily as needed (for  intestinal spasm). 10/12/22   Remo Lipps, PA  methocarbamol (ROBAXIN) 750 MG tablet Take 1 tablet (750 mg total) by mouth every 8 (eight) hours as needed for muscle spasms (for calves). May cause drowsiness 09/22/22   Remo Lipps, PA  nortriptyline (PAMELOR) 10 MG capsule Take 10 mg by mouth at bedtime.    [provider]  predniSONE (DELTASONE) 50 MG tablet Take 1 tablet (50 mg total) by mouth daily with breakfast. 02/20/23   Jene Every, MD  venlafaxine XR (EFFEXOR-XR) 37.5 MG 24 hr capsule Take 1 capsule (37.5 mg total) by mouth daily with breakfast. 09/22/22   Remo Lipps, PA     Allergies  Patient has no known allergies.   Family History   Family History  Problem Relation Age of Onset  . Hypertension Father   . Diabetes Father   . Diabetes Paternal Aunt   . Cancer Maternal Grandmother   . Diabetes Paternal Grandmother      Physical Exam  Triage Vital Signs: ED Triage Vitals  Encounter Vitals Group     BP 05/08/23 2008 127/80     Systolic BP Percentile --      Diastolic BP Percentile --      Pulse Rate 05/08/23 2008 80     Resp 05/08/23 2008 18     Temp 05/08/23 2008 98.1 F (36.7 C)     Temp Source 05/08/23 2008 Oral     SpO2 05/08/23 2008 100 %  Weight --      Height --      Head Circumference --      Peak Flow --      Pain Score 05/08/23 2005 8     Pain Loc --      Pain Education --      Exclude from Growth Chart --     Updated Vital Signs: BP 127/80 (BP Location: Left Arm)   Pulse 80   Temp 98.1 F (36.7 C) (Oral)   Resp 18   SpO2 100%    General: Awake, no distress.  CV:  RRR.  Good peripheral perfusion.  Resp:  Normal effort.  CTAB. Abd:  Mild epigastric tenderness to palpationNo distention.  Other:     ED Results / Procedures / Treatments  Labs (all labs ordered are listed, but only abnormal results are displayed) Labs Reviewed  CBC - Abnormal; Notable for the following components:      Result Value   WBC  12.7 (*)    HCT 38.0 (*)    All other components within normal limits  BASIC METABOLIC PANEL WITH GFR  HEPATIC FUNCTION PANEL  LIPASE, BLOOD  TROPONIN I (HIGH SENSITIVITY)  TROPONIN I (HIGH SENSITIVITY)     EKG  ***   RADIOLOGY *** {You MUST document your own interpretation of imaging, as well as the fact that you reviewed the radiologist's report!:1}  Official radiology report(s): DG Chest 2 View Result Date: 05/08/2023 CLINICAL DATA:  Chest pain. EXAM: CHEST - 2 VIEW COMPARISON:  03/27/2023 FINDINGS: The cardiomediastinal contours are normal. The lungs are clear. Pulmonary vasculature is normal. No consolidation, pleural effusion, or pneumothorax. No acute osseous abnormalities are seen. IMPRESSION: Negative radiographs of the chest. Electronically Signed   By: Narda Rutherford M.D.   On: 05/08/2023 22:46     PROCEDURES:  Critical Care performed: {CriticalCareYesNo:19197::"Yes, see critical care procedure note(s)","No"}  Procedures   MEDICATIONS ORDERED IN ED: Medications  famotidine (PEPCID) IVPB 20 mg premix (has no administration in time range)     IMPRESSION / MDM / ASSESSMENT AND PLAN / ED COURSE  I reviewed the triage vital signs and the nursing notes.                              Differential diagnosis includes, but is not limited to, ***  Patient's presentation is most consistent with {EM COPA:27473}  {If the patient is on the monitor, remove the brackets and asterisks on the sentence below and remember to document it as a Procedure as well. Otherwise delete the sentence below:1} {**The patient is on the cardiac monitor to evaluate for evidence of arrhythmia and/or significant heart rate changes.**}  {Remember to include, when applicable, any/all of the following data: independent review of imaging independent review of labs (comment specifically on pertinent positives and negatives) review of specific prior hospitalizations, PCP/specialist notes,  etc. discuss meds given and prescribed document any discussion with consultants (including hospitalists) any clinical decision tools you used and why (PECARN, NEXUS, etc.) did you consider admitting the patient? document social determinants of health affecting patient's care (homelessness, inability to follow up in a timely fashion, etc) document any pre-existing conditions increasing risk on current visit (e.g. diabetes and HTN increasing danger of high-risk chest pain/ACS) describes what meds you gave (especially parenteral) and why any other interventions?:1}      FINAL CLINICAL IMPRESSION(S) / ED DIAGNOSES   Final diagnoses:  Chest pain,  unspecified type  Epigastric pain     Rx / DC Orders   ED Discharge Orders     None        Note:  This document was prepared using Dragon voice recognition software and may include unintentional dictation errors.

## 2023-05-09 NOTE — Telephone Encounter (Signed)
 Please review.  Thank you,  Joseph Keller

## 2023-05-09 NOTE — Discharge Instructions (Signed)
 Start Protonix daily.  Eat a bland diet x 5 days, then slowly advance diet as tolerated.  Return to the ER for worsening symptoms, persistent vomiting, difficulty breathing or other concerns.

## 2023-05-10 NOTE — Telephone Encounter (Signed)
 PT response.  JM

## 2023-09-25 ENCOUNTER — Emergency Department
Admission: EM | Admit: 2023-09-25 | Discharge: 2023-09-25 | Disposition: A | Discharge status: Against Medical Advice | Payer: PRIVATE HEALTH INSURANCE

## 2023-09-25 ENCOUNTER — Encounter: Payer: Self-pay | Admitting: Emergency Medicine

## 2023-09-25 ENCOUNTER — Other Ambulatory Visit: Payer: Self-pay

## 2023-09-25 DIAGNOSIS — Z5321 Procedure and treatment not carried out due to patient leaving prior to being seen by health care provider: Secondary | ICD-10-CM | POA: Diagnosis not present

## 2023-09-25 DIAGNOSIS — R109 Unspecified abdominal pain: Secondary | ICD-10-CM | POA: Diagnosis present

## 2023-09-25 LAB — CBC
HCT: 39.5 % (ref 39.0–52.0)
Hemoglobin: 13.6 g/dL (ref 13.0–17.0)
MCH: 29.1 pg (ref 26.0–34.0)
MCHC: 34.4 g/dL (ref 30.0–36.0)
MCV: 84.4 fL (ref 80.0–100.0)
Platelets: 234 K/uL (ref 150–400)
RBC: 4.68 MIL/uL (ref 4.22–5.81)
RDW: 13 % (ref 11.5–15.5)
WBC: 8.1 K/uL (ref 4.0–10.5)
nRBC: 0 % (ref 0.0–0.2)

## 2023-09-25 LAB — COMPREHENSIVE METABOLIC PANEL WITH GFR
ALT: 14 U/L (ref 0–44)
AST: 15 U/L (ref 15–41)
Albumin: 4.5 g/dL (ref 3.5–5.0)
Alkaline Phosphatase: 59 U/L (ref 38–126)
Anion gap: 9 (ref 5–15)
BUN: 11 mg/dL (ref 6–20)
CO2: 26 mmol/L (ref 22–32)
Calcium: 9.6 mg/dL (ref 8.9–10.3)
Chloride: 106 mmol/L (ref 98–111)
Creatinine, Ser: 1.14 mg/dL (ref 0.61–1.24)
GFR, Estimated: >60 mL/min (ref >60)
Glucose, Bld: 91 mg/dL (ref 70–99)
Potassium: 4.2 mmol/L (ref 3.5–5.1)
Sodium: 141 mmol/L (ref 135–145)
Total Bilirubin: 1.2 mg/dL (ref 0.0–1.2)
Total Protein: 7.2 g/dL (ref 6.5–8.1)

## 2023-09-25 LAB — LIPASE, BLOOD: Lipase: 32 U/L (ref 11–51)

## 2023-09-25 NOTE — ED Triage Notes (Signed)
 Pt reports abdominal cramping. Denies N/V/D. Pt states I am eating less than normal.

## 2024-02-07 ENCOUNTER — Emergency Department: Payer: Self-pay

## 2024-02-07 ENCOUNTER — Encounter: Payer: Self-pay | Admitting: *Deleted

## 2024-02-07 ENCOUNTER — Other Ambulatory Visit: Payer: Self-pay

## 2024-02-07 ENCOUNTER — Emergency Department
Admission: EM | Admit: 2024-02-07 | Discharge: 2024-02-07 | Disposition: A | Discharge status: Home / Self Care | Payer: Self-pay | Attending: Emergency Medicine | Admitting: Emergency Medicine

## 2024-02-07 DIAGNOSIS — J45909 Unspecified asthma, uncomplicated: Secondary | ICD-10-CM | POA: Insufficient documentation

## 2024-02-07 DIAGNOSIS — M79672 Pain in left foot: Secondary | ICD-10-CM | POA: Insufficient documentation

## 2024-02-07 MED ORDER — MELOXICAM 15 MG PO TABS: 7.5000 mg | ORAL_TABLET | Freq: Every day | ORAL | 0 refills | Status: AC

## 2024-02-07 MED ORDER — ACETAMINOPHEN 325 MG PO TABS
650.0000 mg | ORAL_TABLET | Freq: Once | ORAL | Status: AC
  Administered 2024-02-07: 650 mg via ORAL
  Filled 2024-02-07: qty 2

## 2024-02-07 NOTE — ED Provider Notes (Signed)
 "  [facility] Provider Note    Event Date/Time   First MD Initiated Contact with Patient 02/07/24 1931     (approximate)   History   Foot Pain   HPI  Joseph Keller is a 29 y.o. male  with a past medical history of MDD, ADHD, asthma, anxiety, GERD presents to the emergency department with left anterior foot pain that started two days ago. He reports no known injury to the foot or fall, but states he is on his feet all day at work and lifts heavy items regularly and is unsure if he dropped something on his foot. Denies any ankle pain, swelling or erythema of the foot. Patient has been doing Tylenol  and Ibuprofen  at home for pain.   Physical Exam   Triage Vital Signs: ED Triage Vitals  Encounter Vitals Group     BP 02/07/24 1749 132/80     Girls Systolic BP Percentile --      Girls Diastolic BP Percentile --      Boys Systolic BP Percentile --      Boys Diastolic BP Percentile --      Pulse Rate 02/07/24 1749 85     Resp 02/07/24 1749 18     Temp 02/07/24 1749 97.8 F (36.6 C)     Temp Source 02/07/24 1749 Oral     SpO2 02/07/24 1749 100 %     Weight 02/07/24 1750 145 lb (65.8 kg)     Height 02/07/24 1750 5' (1.524 m)     Head Circumference --      Peak Flow --      Pain Score 02/07/24 1750 7     Pain Loc --      Pain Education --      Exclude from Growth Chart --     Most recent vital signs: Vitals:   02/07/24 1749  BP: 132/80  Pulse: 85  Resp: 18  Temp: 97.8 F (36.6 C)  SpO2: 100%    General: Awake, in no acute distress.  CV: Good peripheral perfusion. DP pulses 2+ b/l. Respiratory:Normal respiratory effort.  No respiratory distress.  GI: Soft, non-distended. MSK: Normal ROM and  5/5 strength in b/l ankles and toes. TTP along anterior aspect of left foot in region of 3rd through 4th metatarsals. Skin:Warm, dry, intact. No rashes, lesions, or ecchymosis. No cyanosis or pallor. No erythema or increased warmth of the foot. No open  wound or swelling. Neurological: A&Ox4 to person, place, time, and situation.   ED Results / Procedures / Treatments   Labs (all labs ordered are listed, but only abnormal results are displayed) Labs Reviewed - No data to display   EKG    RADIOLOGY Left foot x ray FINDINGS: There is no evidence of fracture or dislocation. There is no evidence of arthropathy or other focal bone abnormality. Soft tissues are unremarkable.   IMPRESSION: Negative.   PROCEDURES:  Critical Care performed: No   Procedures   MEDICATIONS ORDERED IN ED: Medications  acetaminophen  (TYLENOL ) tablet 650 mg (650 mg Oral Given 02/07/24 2042)     IMPRESSION / MDM / ASSESSMENT AND PLAN / ED COURSE  I reviewed the triage vital signs and the nursing notes.                              Differential diagnosis includes, but is not limited to, foot ligamentous sprain, foot contusion, foot fracture  Patient's presentation is most consistent with acute complicated illness / injury requiring diagnostic workup.  Patient here with left anterior foot pain as described above. He is neurovascularly intact. No signs of infection. No swelling. No pain in the ankle. X ray left foot ordered in triage. I independently viewed the x-ray and radiologist's report.  I agree with the radiologist's report that there are no acute findings. Patient placed in CAM boot and discussed, RICE, tylenol  and meloxicam  usage. Discussed not taking any other NSAIDs while taking the Meloxicam . Will have him f/u with podiatry outpatient if his symptoms are not improving in one week.   The patient may return to the emergency department for any new, worsening, or concerning symptoms. Patient was given the opportunity to ask questions; all questions were answered. Emergency department return precautions were discussed with the patient.  Patient is in agreement to the treatment plan.  Patient is stable for discharge.   FINAL CLINICAL  IMPRESSION(S) / ED DIAGNOSES   Final diagnoses:  Foot pain, left     Rx / DC Orders   ED Discharge Orders          Ordered    meloxicam  (MOBIC ) 15 MG tablet  Daily        02/07/24 2042             Note:  This document was prepared using Dragon voice recognition software and may include unintentional dictation errors.     Sheron [place], [place] 02/08/24 1508  "

## 2024-02-07 NOTE — ED Triage Notes (Addendum)
 Pt ambulatory to triage  pt has left foot pain.  No known injury to foot.  No swelling noted  Sx for 2 days.  Pt alert.

## 2024-02-07 NOTE — Discharge Instructions (Addendum)
 You were seen emergency department for left foot pain.  This may likely be a sprain of your foot from overuse.  Ice what hurts.  Take over-the-counter Tylenol  as prescribed.  You may also pick up and take meloxicam  that have sent to the pharmacy for you.  Please do not take this medicine with any other NSAID including ibuprofen , Advil , Aleve , aspirin.  Follow-up with the podiatrist listed. Wear the boot to help with stability until your pain resolves.  Return to the ER with any new, worsening or concerning symptoms.
